# Patient Record
Sex: Female | Born: 1975 | Race: White | Hispanic: No | Marital: Married | Smoking: Never smoker
Health system: Southern US, Community
[De-identification: ages and names within clinical notes are randomized; demographics above are authoritative.]

## PROBLEM LIST (undated history)

## (undated) DIAGNOSIS — C44311 Basal cell carcinoma of skin of nose: Secondary | ICD-10-CM

## (undated) DIAGNOSIS — D219 Benign neoplasm of connective and other soft tissue, unspecified: Secondary | ICD-10-CM

## (undated) DIAGNOSIS — E559 Vitamin D deficiency, unspecified: Secondary | ICD-10-CM

## (undated) DIAGNOSIS — M419 Scoliosis, unspecified: Secondary | ICD-10-CM

## (undated) HISTORY — DX: Basal cell carcinoma of skin of nose: C44.311

## (undated) HISTORY — PX: MOHS SURGERY: SHX181

## (undated) HISTORY — DX: Benign neoplasm of connective and other soft tissue, unspecified: D21.9

## (undated) HISTORY — PX: SKIN CANCER DESTRUCTION: SHX778

## (undated) HISTORY — DX: Scoliosis, unspecified: M41.9

## (undated) HISTORY — DX: Vitamin D deficiency, unspecified: E55.9

---

## 2013-04-05 ENCOUNTER — Ambulatory Visit: Payer: Self-pay | Admitting: Family Medicine

## 2014-10-16 ENCOUNTER — Ambulatory Visit: Payer: Self-pay | Admitting: Internal Medicine

## 2016-12-07 ENCOUNTER — Other Ambulatory Visit: Payer: Self-pay | Admitting: Obstetrics and Gynecology

## 2016-12-10 ENCOUNTER — Other Ambulatory Visit: Payer: Self-pay | Admitting: Obstetrics and Gynecology

## 2016-12-10 ENCOUNTER — Other Ambulatory Visit: Payer: Self-pay

## 2016-12-10 DIAGNOSIS — Z309 Encounter for contraceptive management, unspecified: Secondary | ICD-10-CM

## 2016-12-10 MED ORDER — NORGESTIMATE-ETH ESTRADIOL 0.25-35 MG-MCG PO TABS: 1.0000 | ORAL_TABLET | Freq: Every day | ORAL | 0 refills | Status: DC

## 2016-12-10 NOTE — Telephone Encounter (Signed)
Pt is calling to schedule annual 01/07/17 with AMS and needs 1 refill on Birthcontrol to get to appointment [facility] [place]. CB# [phone]

## 2016-12-10 NOTE — Telephone Encounter (Signed)
Pt aware birthcontrol was refilled until she can come in for her appointment in May. KJ CMA

## 2016-12-27 ENCOUNTER — Telehealth: Payer: Self-pay

## 2016-12-27 DIAGNOSIS — Z309 Encounter for contraceptive management, unspecified: Secondary | ICD-10-CM

## 2016-12-27 MED ORDER — NORGESTIMATE-ETH ESTRADIOL 0.25-35 MG-MCG PO TABS: 1.0000 | ORAL_TABLET | Freq: Every day | ORAL | 1 refills | Status: DC

## 2016-12-27 NOTE — Telephone Encounter (Signed)
Pt calling.  Has appt sched for end of May and needs refill on bc.  Refill eRx'd.  Pt's phone is not accepting calls.

## 2017-01-07 ENCOUNTER — Ambulatory Visit: Payer: Self-pay | Admitting: Obstetrics and Gynecology

## 2017-01-30 ENCOUNTER — Ambulatory Visit: Payer: Self-pay | Admitting: Obstetrics & Gynecology

## 2017-03-19 ENCOUNTER — Ambulatory Visit (INDEPENDENT_AMBULATORY_CARE_PROVIDER_SITE_OTHER): Payer: BC Managed Care – PPO | Admitting: Obstetrics & Gynecology

## 2017-03-19 ENCOUNTER — Encounter: Payer: Self-pay | Admitting: Obstetrics & Gynecology

## 2017-03-19 VITALS — BP 110/60 | HR 78 | Ht 66.0 in | Wt 112.0 lb

## 2017-03-19 DIAGNOSIS — Z124 Encounter for screening for malignant neoplasm of cervix: Secondary | ICD-10-CM

## 2017-03-19 DIAGNOSIS — Z800 Family history of malignant neoplasm of digestive organs: Secondary | ICD-10-CM

## 2017-03-19 DIAGNOSIS — Z309 Encounter for contraceptive management, unspecified: Secondary | ICD-10-CM | POA: Diagnosis not present

## 2017-03-19 DIAGNOSIS — Z1231 Encounter for screening mammogram for malignant neoplasm of breast: Secondary | ICD-10-CM

## 2017-03-19 DIAGNOSIS — Z1239 Encounter for other screening for malignant neoplasm of breast: Secondary | ICD-10-CM

## 2017-03-19 DIAGNOSIS — Z0000 Encounter for general adult medical examination without abnormal findings: Secondary | ICD-10-CM

## 2017-03-19 DIAGNOSIS — Z01419 Encounter for gynecological examination (general) (routine) without abnormal findings: Secondary | ICD-10-CM

## 2017-03-19 MED ORDER — NORGESTIMATE-ETH ESTRADIOL 0.25-35 MG-MCG PO TABS: 1.0000 | ORAL_TABLET | Freq: Every day | ORAL | 3 refills | Status: DC

## 2017-03-19 NOTE — Patient Instructions (Signed)
PAP every three years Mammogram every year    Call [phone] to schedule at [facility] yearly (with PCP)   

## 2017-03-19 NOTE — Progress Notes (Signed)
HPI:      [name] is a 41 y.o. [phone] who LMP was No LMP recorded., she presents today for her annual examination. The patient has no complaints today. The patient is sexually active. Her last pap: approximate date 2015 and was normal and last mammogram: patient has never had a mammogram. The patient does perform self breast exams.  There is no notable family history of breast or ovarian cancer in her family.  The patient has regular exercise: yes.  The patient denies current symptoms of depression.    GYN History: Contraception: OCP (estrogen/progesterone)  PMHx: Past Medical History:  Diagnosis Date  . Basal cell carcinoma (BCC) of skin of nose   . Basal cell carcinoma of nose    Past Surgical History:  Procedure Laterality Date  . CESAREAN SECTION    . MOHS SURGERY     Family History  Problem Relation Age of Onset  . Migraines Mother   . Diabetes Father   . Colon cancer Paternal Aunt   . Colon cancer Paternal Uncle   . Congestive Heart Failure Maternal Grandmother   . Diabetes Maternal Grandmother   . Congestive Heart Failure Paternal Grandmother   . Diabetes Paternal Grandmother   . Colon cancer Paternal Grandfather    Social History  Substance Use Topics  . Smoking status: Never Smoker  . Smokeless tobacco: Never Used  . Alcohol use No    Current Outpatient Prescriptions:  .  norgestimate-ethinyl estradiol (ORTHO-CYCLEN,SPRINTEC,PREVIFEM) 0.25-35 MG-MCG tablet, Take 1 tablet by mouth daily., Disp: 1 Package, Rfl: 1 Allergies: Patient has no known allergies.  Review of Systems  Constitutional: Negative for chills, fever and malaise/fatigue.  HENT: Negative for congestion, sinus pain and sore throat.   Eyes: Negative for blurred vision and pain.  Respiratory: Negative for cough and wheezing.   Cardiovascular: Negative for chest pain and leg swelling.  Gastrointestinal: Negative for abdominal pain, constipation, diarrhea, heartburn, nausea and  vomiting.  Genitourinary: Negative for dysuria, frequency, hematuria and urgency.  Musculoskeletal: Negative for back pain, joint pain, myalgias and neck pain.  Skin: Negative for itching and rash.  Neurological: Negative for dizziness, tremors and weakness.  Endo/Heme/Allergies: Does not bruise/bleed easily.  Psychiatric/Behavioral: Negative for depression. The patient is not nervous/anxious and does not have insomnia.     Objective: BP 110/60   Pulse 78   Ht 5\' 6"  (1.676 m)   Wt 112 lb (50.8 kg)   BMI 18.08 kg/m   Filed Weights   03/19/17 1530  Weight: 112 lb (50.8 kg)   Body mass index is 18.08 kg/m. Physical Exam  Constitutional: She is oriented to person, place, and time. She appears well-developed and well-nourished. No distress.  Genitourinary: Rectum normal, vagina normal and uterus normal. Pelvic exam was performed with patient supine. There is no rash or lesion on the right labia. There is no rash or lesion on the left labia. Vagina exhibits no lesion. No bleeding in the vagina. Right adnexum does not display mass and does not display tenderness. Left adnexum does not display mass and does not display tenderness. Cervix does not exhibit motion tenderness, lesion, friability or polyp.   Uterus is mobile and midaxial. Uterus is not enlarged or exhibiting a mass.  HENT:  Head: Normocephalic and atraumatic. Head is without laceration.  Right Ear: Hearing normal.  Left Ear: Hearing normal.  Nose: No epistaxis.  No foreign bodies.  Mouth/Throat: Uvula is midline, oropharynx is clear and moist and mucous membranes  are normal.  Eyes: Pupils are equal, round, and reactive to light.  Neck: Normal range of motion. Neck supple. No thyromegaly present.  Cardiovascular: Normal rate and regular rhythm.  Exam reveals no gallop and no friction rub.   No murmur heard. Pulmonary/Chest: Effort normal and breath sounds normal. No respiratory distress. She has no wheezes. Right breast  exhibits no mass, no skin change and no tenderness. Left breast exhibits no mass, no skin change and no tenderness.  Abdominal: Soft. Bowel sounds are normal. She exhibits no distension. There is no tenderness. There is no rebound.  Musculoskeletal: Normal range of motion.  Neurological: She is alert and oriented to person, place, and time. No cranial nerve deficit.  Skin: Skin is warm and dry.  Psychiatric: She has a normal mood and affect. Judgment normal.  Vitals reviewed.   Assessment:  ANNUAL EXAM 1. Annual physical exam   2. Screening for breast cancer   3. Screening for cervical cancer   4. Encounter for contraceptive management, unspecified type      Screening Plan:            1.  Cervical Screening-  Pap smear done today  2. Breast screening- Exam annually and mammogram>40 planned   3. Colonoscopy every 10 years, Hemoccult testing - after age 59; pt desires sooner due to [place]  4. Labs managed by PCP  5. Counseling for contraception: oral contraceptives (estrogen/progesterone)  Other:  1. Annual physical exam  2. Screening for breast cancer- MMG  3. Screening for cervical cancer- PAP q 3 yrs  4. Encounter for contraceptive management, unspecified type OCP    F/U  Return in about 1 year (around 03/19/2018) for Annual.  Barnett Applebaum, MD, [facility], [facility] 03/19/2017  3:51 PM

## 2017-03-22 LAB — IGP, APTIMA HPV
HPV APTIMA: NEGATIVE
PAP Smear Comment: 0

## 2017-04-08 ENCOUNTER — Telehealth: Payer: Self-pay

## 2017-04-08 ENCOUNTER — Other Ambulatory Visit: Payer: Self-pay

## 2017-04-08 DIAGNOSIS — Z800 Family history of malignant neoplasm of digestive organs: Secondary | ICD-10-CM

## 2017-04-08 NOTE — Telephone Encounter (Signed)
Gastroenterology Pre-Procedure Review  Request Date: 07/08/17 Requesting Physician: Dr. Allen Norris  PATIENT REVIEW QUESTIONS: The patient responded to the following health history questions as indicated:    1. Are you having any GI issues? no 2. Do you have a personal history of Polyps? no 3. Do you have a family history of Colon Cancer or Polyps? yes (Aunt and Uncle on Fathers side) 4. Diabetes Mellitus? no 5. Joint replacements in the past 12 months?no 6. Major health problems in the past 3 months?no 7. Any artificial heart valves, MVP, or defibrillator?no    MEDICATIONS & ALLERGIES:    Patient reports the following regarding taking any anticoagulation/antiplatelet therapy:   Plavix, Coumadin, Eliquis, Xarelto, Lovenox, Pradaxa, Brilinta, or Effient? no Aspirin? no  Patient confirms/reports the following medications:  Current Outpatient Prescriptions  Medication Sig Dispense Refill  . norgestimate-ethinyl estradiol (ORTHO-CYCLEN,SPRINTEC,PREVIFEM) 0.25-35 MG-MCG tablet Take 1 tablet by mouth daily. 3 Package 3   No current facility-administered medications for this visit.     Patient confirms/reports the following allergies:  No Known Allergies  No orders of the defined types were placed in this encounter.   AUTHORIZATION INFORMATION Primary Insurance: 1D#: Group #:  Secondary Insurance: 1D#: Group #:  SCHEDULE INFORMATION: Date: 07/08/17 Time: Location:[facility]

## 2017-04-08 NOTE — Telephone Encounter (Signed)
ok 

## 2017-04-08 NOTE — Telephone Encounter (Signed)
Pt's insurance does not cover the place we referred her to for a colonoscopy as in network. She requests a referral to [facility] gastro as they are in network.  There fax vumber is [phone]

## 2017-04-09 ENCOUNTER — Telehealth: Payer: Self-pay | Admitting: Gastroenterology

## 2017-04-09 NOTE — Telephone Encounter (Signed)
Pt stated that she checked with her insurance company and her insurance would not cover it being done at a hospital therefore she has decided to go with a facility in [place] that her insurance would cover and cost her less money to have done.

## 2017-04-09 NOTE — Telephone Encounter (Signed)
Patient LVM to cx procedure for 07/08/17 w/ Dr. Allen Norris.

## 2017-07-08 ENCOUNTER — Ambulatory Visit: Admit: 2017-07-08 | Payer: Self-pay | Admitting: Gastroenterology

## 2017-07-08 SURGERY — COLONOSCOPY WITH PROPOFOL
Anesthesia: General

## 2018-02-24 ENCOUNTER — Telehealth: Payer: Self-pay

## 2018-02-24 DIAGNOSIS — Z309 Encounter for contraceptive management, unspecified: Secondary | ICD-10-CM

## 2018-02-24 MED ORDER — NORGESTIMATE-ETH ESTRADIOL 0.25-35 MG-MCG PO TABS: 1.0000 | ORAL_TABLET | Freq: Every day | ORAL | 0 refills | Status: DC

## 2018-02-24 NOTE — Telephone Encounter (Signed)
Spoke w/pt. Notified rf erx to pharmacy. Pt advised to check w/pharmacy prior to picking up if not signed up for text alerts.

## 2018-02-24 NOTE — Telephone Encounter (Signed)
Pt is scheduled for AE w/[facility] 03/23/18. Her rx has expired for Sprintec. Pt request refill. AE not due til after 03/19/18. Pharmacy on file is correct.

## 2018-03-23 ENCOUNTER — Ambulatory Visit: Payer: BC Managed Care – PPO | Admitting: Obstetrics & Gynecology

## 2018-03-30 ENCOUNTER — Ambulatory Visit (INDEPENDENT_AMBULATORY_CARE_PROVIDER_SITE_OTHER): Payer: BC Managed Care – PPO | Admitting: Obstetrics & Gynecology

## 2018-03-30 ENCOUNTER — Encounter: Payer: Self-pay | Admitting: Obstetrics & Gynecology

## 2018-03-30 VITALS — BP 120/80 | Ht 66.0 in | Wt 115.0 lb

## 2018-03-30 DIAGNOSIS — Z131 Encounter for screening for diabetes mellitus: Secondary | ICD-10-CM

## 2018-03-30 DIAGNOSIS — Z1239 Encounter for other screening for malignant neoplasm of breast: Secondary | ICD-10-CM

## 2018-03-30 DIAGNOSIS — Z309 Encounter for contraceptive management, unspecified: Secondary | ICD-10-CM

## 2018-03-30 DIAGNOSIS — Z01419 Encounter for gynecological examination (general) (routine) without abnormal findings: Secondary | ICD-10-CM

## 2018-03-30 DIAGNOSIS — Z1231 Encounter for screening mammogram for malignant neoplasm of breast: Secondary | ICD-10-CM

## 2018-03-30 DIAGNOSIS — Z1329 Encounter for screening for other suspected endocrine disorder: Secondary | ICD-10-CM

## 2018-03-30 DIAGNOSIS — Z1321 Encounter for screening for nutritional disorder: Secondary | ICD-10-CM

## 2018-03-30 DIAGNOSIS — Z13220 Encounter for screening for lipoid disorders: Secondary | ICD-10-CM

## 2018-03-30 MED ORDER — NORGESTIMATE-ETH ESTRADIOL 0.25-35 MG-MCG PO TABS: 1.0000 | ORAL_TABLET | Freq: Every day | ORAL | 3 refills | Status: DC

## 2018-03-30 NOTE — Progress Notes (Signed)
HPI:      Jaime Reed is a 42 y.o. [id number] who LMP was Patient's last menstrual period was 02/23/2018., she presents today for her annual examination. The patient has no complaints today. The patient is sexually active. Her last pap: approximate date 2018 and was normal and last mammogram: patient does not recall results of last mammogram. The patient does perform self breast exams.  There is no notable family history of breast or ovarian cancer in her family.  The patient has regular exercise: yes.  The patient denies current symptoms of depression.    GYN History: Contraception: OCP (estrogen/progesterone)  PMHx: Past Medical History:  Diagnosis Date  . Basal cell carcinoma (BCC) of skin of nose   . Basal cell carcinoma of nose    Past Surgical History:  Procedure Laterality Date  . CESAREAN SECTION    . MOHS SURGERY    . SKIN CANCER DESTRUCTION     Family History  Problem Relation Age of Onset  . Migraines Mother   . Diabetes Father   . Colon cancer Paternal Aunt   . Colon cancer Paternal Uncle   . Congestive Heart Failure Maternal Grandmother   . Diabetes Maternal Grandmother   . Congestive Heart Failure Paternal Grandmother   . Diabetes Paternal Grandmother   . Colon cancer Paternal Grandfather    Social History   Tobacco Use  . Smoking status: Never Smoker  . Smokeless tobacco: Never Used  Substance Use Topics  . Alcohol use: No  . Drug use: No    Current Outpatient Medications:  .  norgestimate-ethinyl estradiol (ORTHO-CYCLEN,SPRINTEC,PREVIFEM) 0.25-35 MG-MCG tablet, Take 1 tablet by mouth daily for 28 days., Disp: 3 Package, Rfl: 3 Allergies: Patient has no known allergies.  Review of Systems  Constitutional: Negative for chills, fever and malaise/fatigue.  HENT: Negative for congestion, sinus pain and sore throat.   Eyes: Negative for blurred vision and pain.  Respiratory: Negative for cough and wheezing.   Cardiovascular: Negative for chest  pain and leg swelling.  Gastrointestinal: Negative for abdominal pain, constipation, diarrhea, heartburn, nausea and vomiting.  Genitourinary: Negative for dysuria, frequency, hematuria and urgency.  Musculoskeletal: Negative for back pain, joint pain, myalgias and neck pain.  Skin: Negative for itching and rash.  Neurological: Negative for dizziness, tremors and weakness.  Endo/Heme/Allergies: Does not bruise/bleed easily.  Psychiatric/Behavioral: Negative for depression. The patient is not nervous/anxious and does not have insomnia.     Objective: BP 120/80   Ht 5\' 6"  (1.676 m)   Wt 115 lb (52.2 kg)   LMP 02/23/2018   BMI 18.56 kg/m   Filed Weights   03/30/18 1547  Weight: 115 lb (52.2 kg)   Body mass index is 18.56 kg/m. Physical Exam  Constitutional: She is oriented to person, place, and time. She appears well-developed and well-nourished. No distress.  Genitourinary: Rectum normal, vagina normal and uterus normal. Pelvic exam was performed with patient supine. There is no rash or lesion on the right labia. There is no rash or lesion on the left labia. Vagina exhibits no lesion. No bleeding in the vagina. Right adnexum does not display mass and does not display tenderness. Left adnexum does not display mass and does not display tenderness. Cervix does not exhibit motion tenderness, lesion, friability or polyp.   Uterus is mobile and midaxial. Uterus is not enlarged or exhibiting a mass.  HENT:  Head: Normocephalic and atraumatic. Head is without laceration.  Right Ear: Hearing normal.  Left  Ear: Hearing normal.  Nose: No epistaxis.  No foreign bodies.  Mouth/Throat: Uvula is midline, oropharynx is clear and moist and mucous membranes are normal.  Eyes: Pupils are equal, round, and reactive to light.  Neck: Normal range of motion. Neck supple. No thyromegaly present.  Cardiovascular: Normal rate and regular rhythm. Exam reveals no gallop and no friction rub.  No murmur  heard. Pulmonary/Chest: Effort normal and breath sounds normal. No respiratory distress. She has no wheezes. Right breast exhibits no mass, no skin change and no tenderness. Left breast exhibits no mass, no skin change and no tenderness.  Abdominal: Soft. Bowel sounds are normal. She exhibits no distension. There is no tenderness. There is no rebound.  Musculoskeletal: Normal range of motion.  Neurological: She is alert and oriented to person, place, and time. No cranial nerve deficit.  Skin: Skin is warm and dry.  Psychiatric: She has a normal mood and affect. Judgment normal.  Vitals reviewed.  Assessment:  ANNUAL EXAM 1. Screening for diabetes mellitus   2. Encounter for contraceptive management, unspecified type   3. Screening for breast cancer   4. Screening for thyroid disorder   5. Encounter for vitamin deficiency screening   6. Screening for cholesterol level    Screening Plan:            1.  Cervical Screening-  Pap smear done today  2. Breast screening- Exam annually and mammogram>40 planned   3. Colonoscopy every 10 years, Hemoccult testing - after age 33 due to family history and personal concerns  4. Labs To return fasting at a later date  5. Counseling for contraception: oral contraceptives (estrogen/progesterone)    F/U  Return in about 1 year (around 03/31/2019) for Annual.  Barnett Applebaum, MD, [facility], [facility] 03/30/2018  4:15 PM

## 2018-03-30 NOTE — Patient Instructions (Signed)
PAP every three years Mammogram every year    Call [phone] to schedule at [facility] soon

## 2018-04-03 ENCOUNTER — Other Ambulatory Visit: Payer: BC Managed Care – PPO

## 2018-04-03 DIAGNOSIS — Z1321 Encounter for screening for nutritional disorder: Secondary | ICD-10-CM

## 2018-04-03 DIAGNOSIS — Z13220 Encounter for screening for lipoid disorders: Secondary | ICD-10-CM

## 2018-04-03 DIAGNOSIS — Z1329 Encounter for screening for other suspected endocrine disorder: Secondary | ICD-10-CM

## 2018-04-03 DIAGNOSIS — Z131 Encounter for screening for diabetes mellitus: Secondary | ICD-10-CM

## 2018-04-04 LAB — LIPID PANEL
CHOL/HDL RATIO: 2.5 ratio (ref 0.0–4.4)
Cholesterol, Total: 169 mg/dL (ref 100–199)
HDL: 68 mg/dL (ref >39)
LDL CALC: 69 mg/dL (ref 0–99)
Triglycerides: 159 mg/dL — ABNORMAL HIGH (ref 0–149)
VLDL CHOLESTEROL CAL: 32 mg/dL (ref 5–40)

## 2018-04-04 LAB — VITAMIN D 25 HYDROXY (VIT D DEFICIENCY, FRACTURES): Vit D, 25-Hydroxy: 25.4 ng/mL — ABNORMAL LOW (ref 30.0–100.0)

## 2018-04-04 LAB — GLUCOSE, FASTING: Glucose, Plasma: 85 mg/dL (ref 65–99)

## 2018-04-04 LAB — TSH: TSH: 1.94 u[IU]/mL (ref 0.450–4.500)

## 2018-04-14 ENCOUNTER — Encounter: Payer: Self-pay | Admitting: Obstetrics & Gynecology

## 2018-05-11 ENCOUNTER — Other Ambulatory Visit: Payer: Self-pay | Admitting: Obstetrics & Gynecology

## 2018-07-06 ENCOUNTER — Telehealth: Payer: Self-pay

## 2018-07-06 NOTE — Telephone Encounter (Signed)
Left voice message to remind pt to schedule her mammo 

## 2018-07-06 NOTE — Telephone Encounter (Signed)
-----   Message from Gae Dry, MD sent at 07/06/2018  8:06 AM EST ----- Regarding: MMG Received notice she has not received MMG yet as ordered at her Annual. Please check and encourage her to do this, and document conversation.

## 2018-12-22 ENCOUNTER — Ambulatory Visit: Payer: BC Managed Care – PPO | Admitting: Obstetrics & Gynecology

## 2018-12-29 ENCOUNTER — Ambulatory Visit (INDEPENDENT_AMBULATORY_CARE_PROVIDER_SITE_OTHER): Payer: BC Managed Care – PPO | Admitting: Obstetrics & Gynecology

## 2018-12-29 ENCOUNTER — Other Ambulatory Visit: Payer: Self-pay

## 2018-12-29 ENCOUNTER — Encounter: Payer: Self-pay | Admitting: Obstetrics & Gynecology

## 2018-12-29 VITALS — Ht 66.0 in | Wt 117.0 lb

## 2018-12-29 DIAGNOSIS — R102 Pelvic and perineal pain: Secondary | ICD-10-CM | POA: Diagnosis not present

## 2018-12-29 NOTE — Progress Notes (Signed)
Virtual Visit via Telephone Note  I connected with Jaime Reed on 12/29/18 at  2:50 PM EDT by telephone and verified that I am speaking with the correct person using two identifiers.   I discussed the limitations, risks, security and privacy concerns of performing an evaluation and management service by telephone and the availability of in person appointments. I also discussed with the patient that there may be a patient responsible charge related to this service. The patient expressed understanding and agreed to proceed. She was at home and I was in my office.  History of Present Illness: Chief Complaint  Patient presents with  . Pelvic Pain   History of Present Illness:   Patient is a 43 y.o. [id number] who LMP was last month and she has regular cycles on OCPs, presents today for a problem visit.  She complains of pain.   Her pain is localized to the RLQ and LLQ area, described as intermittent, sharp and stabbing, began several months ago and its severity is described as moderate. The pain radiates to the  Non-radiating. She nonehas these associated symptoms which include . Patient has these modifiers which include relaxation that make it better and activity; always seems to happen at night when she lies down for bed that make it worse.  Context includes: prior surgery (CS) but no other context.  Previous studies include none.  Recent Urine culture neg for UTI.  PMHx: She  has a past medical history of Basal cell carcinoma (BCC) of skin of nose and Basal cell carcinoma of nose. Also,  has a past surgical history that includes Cesarean section; Mohs surgery; and Skin cancer destruction., family history includes Colon cancer in her paternal aunt, paternal grandfather, and paternal uncle; Congestive Heart Failure in her maternal grandmother and paternal grandmother; Diabetes in her father, maternal grandmother, and paternal grandmother; Migraines in her mother.,  reports that she has never smoked.  She has never used smokeless tobacco. She reports that she does not drink alcohol or use drugs.  She has a current medication list which includes the following prescription(s): norgestimate-ethinyl estradiol. Also, has No Known Allergies.  Review of Systems  Constitutional: Negative for chills, fever and malaise/fatigue.  HENT: Negative for congestion, sinus pain and sore throat.   Eyes: Negative for blurred vision and pain.  Respiratory: Negative for cough and wheezing.   Cardiovascular: Negative for chest pain and leg swelling.  Gastrointestinal: Negative for abdominal pain, constipation, diarrhea, heartburn, nausea and vomiting.  Genitourinary: Negative for dysuria, frequency, hematuria and urgency.  Musculoskeletal: Negative for back pain, joint pain, myalgias and neck pain.  Skin: Negative for itching and rash.  Neurological: Negative for dizziness, tremors and weakness.  Endo/Heme/Allergies: Does not bruise/bleed easily.  Psychiatric/Behavioral: Negative for depression. The patient is not nervous/anxious and does not have insomnia.     Observations/Objective: No exam today, due to telephone eVisit due to [facility] virus restriction on elective visits and procedures.  Prior visits reviewed along with ultrasounds/labs as indicated.  Assessment and Plan: 1. Pelvic pain Etiology options discussed; mass, cysts, hydro, adhesive dx, endometriosis, other - Evaluation to include exam, [place] - US PELVIC COMPLETE WITH TRANSVAGINAL - NSAIDs at dinnertime to see if helps with nighttime pain.  Pros and cons of this medication ,odality discussed.  (no Rx, use OTC NSAID here at first).  Follow Up Instructions: GYN [place] and appt w exam   I discussed the assessment and treatment plan with the patient. The patient was provided an opportunity  to ask questions and all were answered. The patient agreed with the plan and demonstrated an understanding of the instructions.   The patient was advised to call  back or seek an in-person evaluation if the symptoms worsen or if the condition fails to improve as anticipated.  I provided 12 minutes of non-face-to-face time during this encounter.  Hoyt Koch, MD [facility], [facility] 12/29/2018  3:37 PM

## 2019-01-07 ENCOUNTER — Other Ambulatory Visit: Payer: Self-pay | Admitting: Obstetrics & Gynecology

## 2019-01-07 ENCOUNTER — Ambulatory Visit (INDEPENDENT_AMBULATORY_CARE_PROVIDER_SITE_OTHER): Payer: BC Managed Care – PPO

## 2019-01-07 ENCOUNTER — Encounter: Payer: Self-pay | Admitting: Obstetrics & Gynecology

## 2019-01-07 ENCOUNTER — Ambulatory Visit (INDEPENDENT_AMBULATORY_CARE_PROVIDER_SITE_OTHER): Payer: BC Managed Care – PPO | Admitting: Obstetrics & Gynecology

## 2019-01-07 ENCOUNTER — Telehealth: Payer: Self-pay | Admitting: Obstetrics & Gynecology

## 2019-01-07 ENCOUNTER — Other Ambulatory Visit: Payer: Self-pay

## 2019-01-07 VITALS — BP 120/80 | Ht 66.0 in | Wt 121.0 lb

## 2019-01-07 DIAGNOSIS — D251 Intramural leiomyoma of uterus: Secondary | ICD-10-CM

## 2019-01-07 DIAGNOSIS — R102 Pelvic and perineal pain unspecified side: Secondary | ICD-10-CM

## 2019-01-07 DIAGNOSIS — D219 Benign neoplasm of connective and other soft tissue, unspecified: Secondary | ICD-10-CM

## 2019-01-07 DIAGNOSIS — D252 Subserosal leiomyoma of uterus: Secondary | ICD-10-CM

## 2019-01-07 NOTE — Telephone Encounter (Signed)
Left message to advise pt we could do the u/s either way, it was up to her

## 2019-01-07 NOTE — Telephone Encounter (Signed)
Patient is calling to find out if it's ok to be menstruating during this appointment schedule today for TVUS and follow up or if she need to reschedule. Please advise patient's appointment starts at 2:30 today. Thank you!

## 2019-01-07 NOTE — Patient Instructions (Signed)

## 2019-01-07 NOTE — Progress Notes (Signed)
HPI: Uterine Fibroids Patient presents with new findings of uterine fibroids.  Pt has pain more so than period concerns.  Her pain is localized to the RLQ and LLQ area, described as intermittent, sharp and stabbing, began several months ago and its severity is described as moderate. The pain radiates to the  Non-radiating. She nonehas these associated symptoms which include . Patient has these modifiers which include relaxation that make it better and activity; always seems to happen at night when she lies down for bed that make it worse.  Context includes: prior surgery (CS) but no other context.   Periods are regular every 28-30 days, lasting 4 days. Takes OCPs. Dysmenorrhea:mild, occurring throughout menses. Cyclic symptoms include none. No intermenstrual bleeding, spotting, or discharge.  Ultrasound demonstrates fibroids see below  PMHx: She  has a past medical history of Basal cell carcinoma (BCC) of skin of nose and Basal cell carcinoma of nose. Also,  has a past surgical history that includes Cesarean section; Mohs surgery; and Skin cancer destruction., family history includes Colon cancer in her paternal aunt, paternal grandfather, and paternal uncle; Congestive Heart Failure in her maternal grandmother and paternal grandmother; Diabetes in her father, maternal grandmother, and paternal grandmother; Migraines in her mother.,  reports that she has never smoked. She has never used smokeless tobacco. She reports that she does not drink alcohol or use drugs.  She has a current medication list which includes the following prescription(s): norgestimate-ethinyl estradiol. Also, has No Known Allergies.  Review of Systems  All other systems reviewed and are negative.   Objective: BP 120/80   Ht 5\' 6"  (1.676 m)   Wt 121 lb (54.9 kg)   LMP 01/07/2019   BMI 19.53 kg/m   Physical examination Constitutional NAD, Conversant  Skin No rashes, lesions or ulceration.   Extremities: Moves all  appropriately.  Normal ROM for age. No lymphadenopathy.  Neuro: Grossly intact  Psych: Oriented to PPT.  Normal mood. Normal affect.   US Pelvis Transvanginal Non-ob (tv Only)  Result Date: 01/07/2019 Patient Name: Jaime Reed DOB: 05-05-1976 MRN: [id number] ULTRASOUND REPORT Location: [facility] Date of Service: 01/07/2019 Indications:Pelvic Pain Findings: The uterus is anteverted and measures 10.6 x 6.9 x 5.7cm. Echo texture is heterogenous with evidence of focal masses. Within the uterus are multiple suspected fibroids measuring: Fibroid 1:  1.7 x 1.4 x 1.0cm (RT/calcified, SS) Fibroid 2:  4.0 x 3.7 x 3.0cm (RT, IM) Fibroid 3:  1.3 x 1.0 x 1.0cm (LT, SS) Multiple other fibroids seen <18mm. The Endometrium measures 6.9 mm. Right Ovary measures 2.3 x 1.9 x 1.8 cm. It is normal in appearance. Left Ovary measures 2.0 x 1.6 x 1.3 cm. It is normal in appearance. Survey of the adnexa demonstrates no adnexal masses. There is no free fluid in the cul de sac. Impression: 1. Heterogeneous, multi-fibroid uterus. Recommendations: 1.Clinical correlation with the patient's History and Physical Exam. Vita Barley, RT Review of ULTRASOUND.    I have personally reviewed images and report of recent ultrasound done at [facility]    Plan of management to be discussed with patient. Barnett Applebaum, MD, [facility], [facility] 01/07/2019  3:45 PM    Assessment: 1. Fibroids - Fibroid treatment such as [place], Lupron, Myomectomy, and Hysterectomy discussed in detail, with the pros and cons of each choice counseled.  No treatment as an option also discussed, as well as control of symptoms alone with hormone therapy. Information provided to the patient. -  US PELVIC COMPLETE WITH TRANSVAGINAL; Future monitoring for growth in 6 mos or as needed  2. Pelvic pain - OCP, IBF, Heat   A total of 20 minutes were spent face-to-face with the patient during this encounter and over half of that time dealt with  counseling and coordination of care.   Barnett Applebaum, MD, [facility], [facility] 01/07/2019  3:58 PM

## 2019-01-07 NOTE — Telephone Encounter (Signed)
Its OK unless she is very uncomfortable Reschedule if desired

## 2019-05-26 ENCOUNTER — Other Ambulatory Visit: Payer: Self-pay

## 2019-05-27 ENCOUNTER — Other Ambulatory Visit: Payer: Self-pay

## 2019-05-27 DIAGNOSIS — Z309 Encounter for contraceptive management, unspecified: Secondary | ICD-10-CM

## 2019-05-27 MED ORDER — NORGESTIMATE-ETH ESTRADIOL 0.25-35 MG-MCG PO TABS: 1.0000 | ORAL_TABLET | Freq: Every day | ORAL | 3 refills | Status: DC

## 2019-05-27 NOTE — Telephone Encounter (Signed)
Pt had to r/s annual d/t husband testing positive for COVID. Sent in RX to last to annual 10/16.

## 2019-06-02 ENCOUNTER — Ambulatory Visit: Payer: BC Managed Care – PPO | Admitting: Obstetrics & Gynecology

## 2019-06-18 ENCOUNTER — Ambulatory Visit: Payer: BC Managed Care – PPO | Admitting: Obstetrics & Gynecology

## 2019-07-05 ENCOUNTER — Ambulatory Visit: Payer: BC Managed Care – PPO

## 2019-07-05 ENCOUNTER — Ambulatory Visit: Payer: BC Managed Care – PPO | Admitting: Obstetrics & Gynecology

## 2019-07-07 ENCOUNTER — Ambulatory Visit: Payer: BC Managed Care – PPO

## 2019-07-07 ENCOUNTER — Ambulatory Visit: Payer: BC Managed Care – PPO | Admitting: Obstetrics & Gynecology

## 2019-07-08 ENCOUNTER — Other Ambulatory Visit: Payer: BC Managed Care – PPO

## 2019-07-08 ENCOUNTER — Ambulatory Visit: Payer: BC Managed Care – PPO | Admitting: Obstetrics & Gynecology

## 2019-07-22 ENCOUNTER — Ambulatory Visit (INDEPENDENT_AMBULATORY_CARE_PROVIDER_SITE_OTHER): Payer: BC Managed Care – PPO | Admitting: Obstetrics & Gynecology

## 2019-07-22 ENCOUNTER — Other Ambulatory Visit: Payer: Self-pay

## 2019-07-22 ENCOUNTER — Encounter: Payer: Self-pay | Admitting: Obstetrics & Gynecology

## 2019-07-22 ENCOUNTER — Ambulatory Visit (INDEPENDENT_AMBULATORY_CARE_PROVIDER_SITE_OTHER): Payer: BC Managed Care – PPO

## 2019-07-22 VITALS — BP 130/80 | Ht 65.0 in | Wt 127.0 lb

## 2019-07-22 DIAGNOSIS — R102 Pelvic and perineal pain: Secondary | ICD-10-CM | POA: Diagnosis not present

## 2019-07-22 DIAGNOSIS — D252 Subserosal leiomyoma of uterus: Secondary | ICD-10-CM

## 2019-07-22 DIAGNOSIS — D219 Benign neoplasm of connective and other soft tissue, unspecified: Secondary | ICD-10-CM

## 2019-07-22 DIAGNOSIS — Z01419 Encounter for gynecological examination (general) (routine) without abnormal findings: Secondary | ICD-10-CM | POA: Diagnosis not present

## 2019-07-22 DIAGNOSIS — Z1231 Encounter for screening mammogram for malignant neoplasm of breast: Secondary | ICD-10-CM

## 2019-07-22 DIAGNOSIS — Z309 Encounter for contraceptive management, unspecified: Secondary | ICD-10-CM

## 2019-07-22 MED ORDER — NORGESTIMATE-ETH ESTRADIOL 0.25-35 MG-MCG PO TABS: 1.0000 | ORAL_TABLET | Freq: Every day | ORAL | 3 refills | Status: DC

## 2019-07-22 NOTE — Progress Notes (Signed)
HPI:      [name] is a 43 y.o. [id number] who LMP was Patient's last menstrual period was 06/28/2019., she presents today for her annual examination. The patient has no complaints today. The patient is sexually active. Her last pap: approximate date 2018 and was normal and last mammogram: patient has never had a mammogram. The patient does perform self breast exams.  There is no notable family history of breast or ovarian cancer in her family.  The patient has regular exercise: yes.  The patient denies current symptoms of depression.    History of fibroids [place] today, size of three fibroids relatively unchanged Pt has sx's of pressure at times on bladder or pelvis; periods are reg on OCPs, min bladder sx's  GYN History: Contraception: OCP (estrogen/progesterone)  PMHx: Past Medical History:  Diagnosis Date  . Basal cell carcinoma (BCC) of skin of nose   . Basal cell carcinoma of nose    Past Surgical History:  Procedure Laterality Date  . CESAREAN SECTION    . MOHS SURGERY    . SKIN CANCER DESTRUCTION     Family History  Problem Relation Age of Onset  . Migraines Mother   . Diabetes Father   . Colon cancer Paternal Aunt   . Colon cancer Paternal Uncle   . Congestive Heart Failure Maternal Grandmother   . Diabetes Maternal Grandmother   . Congestive Heart Failure Paternal Grandmother   . Diabetes Paternal Grandmother   . Colon cancer Paternal Grandfather    Social History   Tobacco Use  . Smoking status: Never Smoker  . Smokeless tobacco: Never Used  Substance Use Topics  . Alcohol use: No  . Drug use: No    Current Outpatient Medications:  .  norgestimate-ethinyl estradiol (ORTHO-CYCLEN) 0.25-35 MG-MCG tablet, Take 1 tablet by mouth daily for 28 days., Disp: 3 Package, Rfl: 3 Allergies: Patient has no known allergies.  Review of Systems  Constitutional: Negative for chills, fever and malaise/fatigue.  HENT: Negative for congestion, sinus pain and sore  throat.   Eyes: Negative for blurred vision and pain.  Respiratory: Negative for cough and wheezing.   Cardiovascular: Negative for chest pain and leg swelling.  Gastrointestinal: Negative for abdominal pain, constipation, diarrhea, heartburn, nausea and vomiting.  Genitourinary: Negative for dysuria, frequency, hematuria and urgency.  Musculoskeletal: Negative for back pain, joint pain, myalgias and neck pain.  Skin: Negative for itching and rash.  Neurological: Negative for dizziness, tremors and weakness.  Endo/Heme/Allergies: Does not bruise/bleed easily.  Psychiatric/Behavioral: Negative for depression. The patient is not nervous/anxious and does not have insomnia.     Objective: BP 130/80   Ht 5\' 5"  (1.651 m)   Wt 127 lb (57.6 kg)   LMP 06/28/2019   BMI 21.13 kg/m   Filed Weights   07/22/19 1433  Weight: 127 lb (57.6 kg)   Body mass index is 21.13 kg/m. Physical Exam Constitutional:      General: She is not in acute distress.    Appearance: She is well-developed.  Genitourinary:     Pelvic exam was performed with patient supine.     Vagina and rectum normal.     No lesions in the vagina.     No vaginal bleeding.     No cervical motion tenderness, friability, lesion or polyp.     Uterus is enlarged and mobile.     No uterine mass detected.    Uterus is anteverted and globular.  No right or left adnexal mass present.     Right adnexa not tender.     Left adnexa not tender.     Genitourinary Comments: 8 week sized  HENT:     Head: Normocephalic and atraumatic. No laceration.     Right Ear: Hearing normal.     Left Ear: Hearing normal.     Mouth/Throat:     Pharynx: Uvula midline.  Eyes:     Pupils: Pupils are equal, round, and reactive to light.  Neck:     Musculoskeletal: Normal range of motion and neck supple.     Thyroid: No thyromegaly.  Cardiovascular:     Rate and Rhythm: Normal rate and regular rhythm.     Heart sounds: No murmur. No friction rub.  No gallop.   Pulmonary:     Effort: Pulmonary effort is normal. No respiratory distress.     Breath sounds: Normal breath sounds. No wheezing.  Chest:     Breasts:        Right: No mass, skin change or tenderness.        Left: No mass, skin change or tenderness.  Abdominal:     General: Bowel sounds are normal. There is no distension.     Palpations: Abdomen is soft.     Tenderness: There is no abdominal tenderness. There is no rebound.  Musculoskeletal: Normal range of motion.  Neurological:     Mental Status: She is alert and oriented to person, place, and time.     Cranial Nerves: No cranial nerve deficit.  Skin:    General: Skin is warm and dry.  Psychiatric:        Judgment: Judgment normal.  Vitals signs reviewed.     Assessment:  ANNUAL EXAM 1. Women's annual routine gynecological examination   2. Encounter for contraceptive management, unspecified type   3. Encounter for screening mammogram for malignant neoplasm of breast   4. Fibroids      Screening Plan:            1.  Cervical Screening-  Pap smear schedule reviewed with patient  2. Breast screening- Exam annually and mammogram>40 planned   3. Colonoscopy every 10 years, Hemoccult testing - after age 43  4. Labs - last year WNL  5. Counseling for contraception: oral contraceptives (estrogen/progesterone)  Encounter for contraceptive management, unspecified type - norgestimate-ethinyl estradiol (ORTHO-CYCLEN) 0.25-35 MG-MCG tablet; Take 1 tablet by mouth daily for 28 days.  Dispense: 3 Package; Refill: 3   6. Fibroids Fibroid treatment such as [place], Lupron, Myomectomy, and Hysterectomy discussed in detail, with the pros and cons of each choice counseled.  No treatment as an option also discussed, as well as control of symptoms alone with hormone therapy. Information provided to the patient.     F/U  Return in about 1 year (around 07/21/2020) for Annual.  Barnett Applebaum, MD, [facility], [facility] 07/22/2019  3:13 PM

## 2019-07-22 NOTE — Patient Instructions (Signed)
PAP every three years Mammogram every year    Call [phone] to schedule at [facility]oscopy every 10 years Labs yearly (with PCP)   

## 2019-08-10 ENCOUNTER — Telehealth: Payer: Self-pay

## 2019-10-20 ENCOUNTER — Telehealth: Payer: Self-pay

## 2019-10-20 NOTE — Telephone Encounter (Signed)
Left voicemail to advise pt to schedule mammo

## 2019-10-20 NOTE — Telephone Encounter (Signed)
-----   Message from Gae Dry, MD sent at 10/18/2019  1:10 PM EST ----- Regarding: MMG Received notice she has not received MMG yet as ordered at her Annual. Please check and encourage her to do this, and document conversation.

## 2020-02-16 NOTE — Telephone Encounter (Signed)
Pt aware.

## 2020-08-22 NOTE — Patient Instructions (Addendum)
I value your feedback and entrusting us with your care. If you get a [facility] patient survey, I would appreciate you taking the time to let us know about your experience today. Thank you!  As of August 12, 2019, your lab results will be released to your MyChart immediately, before I even have a chance to see them. Please give me time to review them and contact you if there are any abnormalities. Thank you for your patience.   [facility]: [phone]  [facility]: [phone]  

## 2020-08-22 NOTE — Progress Notes (Signed)
PCP:  Patient, No Pcp Per   Chief Complaint  Patient presents with  . Gynecologic Exam    Pelvic pain due to fibroids     HPI:      [name] is a 44 y.o. [id number] whose LMP was Patient's last menstrual period was 07/24/2020 (approximate)., presents today for her annual examination.  Her menses are regular every 28-30 days, lasting 4 days.  Dysmenorrhea none. She does not have intermenstrual bleeding.  Has pelvic pain with sex and with lying down, BLQ but RT>LT usually. Moving to change positions can increase pain, but pain improved in certain position. Pain is usually dull. Pt thinks related to fibroids but per 11/20 GYN u/s, leio very small. Pt with hx of scoliosis, not exercising. Has loose stools/diarrhea hx but sx improved since covid.  Fibroid 1: 42.4 x 40.0 x 45.0 mm subserosal right posterior Fibroid 2:11.2 x 10.0 x 10.6 mm subserosal anterior  Fibroid 3: 14.2 x 12.6 x 9.7 mm subserosal anterior left, calcified   Sex activity: currently sex active; contraception OCPs Last Pap: 03/19/17  Results were: no abnormalities /neg HPV DNA   Last mammogram: never There is no FH of breast cancer. There is no FH of ovarian cancer. The patient does not do self-breast exams.  Tobacco use: The patient denies current or previous tobacco use. Alcohol use: none No drug use.  Exercise: min active  Colonoscopy: never; has FH colon cancer in her uncle and aunt with polyps. Needs procedure at OP site for insurance purposes.  She does get adequate calcium but not Vitamin D in her diet. No recent labs.    Past Medical History:  Diagnosis Date  . Basal cell carcinoma (BCC) of skin of nose   . Leiomyoma   . Scoliosis     Past Surgical History:  Procedure Laterality Date  . CESAREAN SECTION    . MOHS SURGERY    . SKIN CANCER DESTRUCTION      Family History  Problem Relation Age of Onset  . Migraines Mother   . Diabetes Father   . Colon polyps Paternal Aunt   . Colon  cancer Paternal Uncle   . Congestive Heart Failure Maternal Grandmother   . Diabetes Maternal Grandmother   . Congestive Heart Failure Paternal Grandmother   . Diabetes Paternal Grandmother     Social History   Socioeconomic History  . Marital status: Married    Spouse name: Not on file  . Number of children: Not on file  . Years of education: Not on file  . Highest education level: Not on file  Occupational History  . Not on file  Tobacco Use  . Smoking status: Never Smoker  . Smokeless tobacco: Never Used  Vaping Use  . Vaping Use: Never used  Substance and Sexual Activity  . Alcohol use: No  . Drug use: No  . Sexual activity: Yes    Birth control/protection: Pill  Other Topics Concern  . Not on file  Social History Narrative  . Not on file   Social Determinants of Health   Financial Resource Strain: Not on file  Food Insecurity: Not on file  Transportation Needs: Not on file  Physical Activity: Not on file  Stress: Not on file  Social Connections: Not on file  Intimate Partner Violence: Not on file     Current Outpatient Medications:  .  norgestimate-ethinyl estradiol (ORTHO-CYCLEN) 0.25-35 MG-MCG tablet, Take 1 tablet by mouth daily for 28 days., Disp:  84 tablet, Rfl: 3     ROS:  Review of Systems  Constitutional: Negative for fatigue, fever and unexpected weight change.  Respiratory: Negative for cough, shortness of breath and wheezing.   Cardiovascular: Negative for chest pain, palpitations and leg swelling.  Gastrointestinal: Positive for diarrhea. Negative for blood in stool, constipation, nausea and vomiting.  Endocrine: Negative for cold intolerance, heat intolerance and polyuria.  Genitourinary: Positive for dyspareunia and pelvic pain. Negative for dysuria, flank pain, frequency, genital sores, hematuria, menstrual problem, urgency, vaginal bleeding, vaginal discharge and vaginal pain.  Musculoskeletal: Negative for back pain, joint swelling and  myalgias.  Skin: Negative for rash.  Neurological: Negative for dizziness, syncope, light-headedness, numbness and headaches.  Hematological: Negative for adenopathy.  Psychiatric/Behavioral: Negative for agitation, confusion, sleep disturbance and suicidal ideas. The patient is not nervous/anxious.   BREAST: No symptoms   Objective: BP 100/60   Ht 5\' 6"  (1.676 m)   Wt 123 lb (55.8 kg)   LMP 07/24/2020 (Approximate)   BMI 19.85 kg/m    Physical Exam Constitutional:      Appearance: She is well-developed.  Genitourinary:     Vulva normal.     Right Labia: No rash, tenderness or lesions.    Left Labia: No tenderness, lesions or rash.    No vaginal discharge, erythema or tenderness.      Right Adnexa: tender.    Right Adnexa: no mass present.    Left Adnexa: tender.    Left Adnexa: no mass present.    No cervical friability or polyp.     Uterus is tender.     Uterus is not enlarged.  Breasts:     Right: No mass, nipple discharge, skin change or tenderness.     Left: No mass, nipple discharge, skin change or tenderness.    Neck:     Thyroid: No thyromegaly.  Cardiovascular:     Rate and Rhythm: Normal rate and regular rhythm.     Heart sounds: Normal heart sounds. No murmur heard.   Pulmonary:     Effort: Pulmonary effort is normal.     Breath sounds: Normal breath sounds.  Abdominal:     Palpations: Abdomen is soft.     Tenderness: There is no abdominal tenderness. There is no guarding or rebound.  Musculoskeletal:        General: Normal range of motion.     Cervical back: Normal range of motion.  Lymphadenopathy:     Cervical: No cervical adenopathy.  Neurological:     General: No focal deficit present.     Mental Status: She is alert and oriented to person, place, and time.     Cranial Nerves: No cranial nerve deficit.  Skin:    General: Skin is warm and dry.  Psychiatric:        Mood and Affect: Mood normal.        Behavior: Behavior normal.         Thought Content: Thought content normal.        Judgment: Judgment normal.  Vitals reviewed.     Assessment/Plan: Encounter for annual routine gynecological examination  Encounter for surveillance of contraceptive pills - Plan: norgestimate-ethinyl estradiol (ORTHO-CYCLEN) 0.25-35 MG-MCG tablet; OCP RF  Encounter for screening mammogram for malignant neoplasm of breast - Plan: MM 3D SCREEN BREAST BILATERAL; pt to sched mammo  Pelvic pain--doubt it's leio related due to small size. Given that sx are more positional, think it's MSK. Back/pelvis stretching, offered pelvic PT. Pt  to consider and f/u prn ref  Screening for colon cancer - Plan: Ambulatory referral to Gastroenterology; refer to GI for procedure at [facility].  Blood tests for routine general physical examination - Plan: Comprehensive metabolic panel, Lipid panel  Screening cholesterol level - Plan: Lipid panel  Meds ordered this encounter  Medications  . norgestimate-ethinyl estradiol (ORTHO-CYCLEN) 0.25-35 MG-MCG tablet    Sig: Take 1 tablet by mouth daily for 28 days.    Dispense:  84 tablet    Refill:  3    Order Specific Question:   Supervising Provider    Answer:   Gae Dry [[id number]]             GYN counsel breast self exam, mammography screening, adequate intake of calcium and vitamin D, diet and exercise     F/U  Return in about 1 day (around 08/24/2020) for fasting labs.  Alpa Salvo B. Ifeanyichukwu Wickham, PA-C 08/23/2020 10:59 AM

## 2020-08-23 ENCOUNTER — Other Ambulatory Visit: Payer: Self-pay

## 2020-08-23 ENCOUNTER — Encounter: Payer: Self-pay | Admitting: Obstetrics and Gynecology

## 2020-08-23 ENCOUNTER — Ambulatory Visit (INDEPENDENT_AMBULATORY_CARE_PROVIDER_SITE_OTHER): Payer: BC Managed Care – PPO | Admitting: Obstetrics and Gynecology

## 2020-08-23 VITALS — BP 100/60 | Ht 66.0 in | Wt 123.0 lb

## 2020-08-23 DIAGNOSIS — Z01419 Encounter for gynecological examination (general) (routine) without abnormal findings: Secondary | ICD-10-CM | POA: Diagnosis not present

## 2020-08-23 DIAGNOSIS — Z3041 Encounter for surveillance of contraceptive pills: Secondary | ICD-10-CM | POA: Diagnosis not present

## 2020-08-23 DIAGNOSIS — Z1211 Encounter for screening for malignant neoplasm of colon: Secondary | ICD-10-CM

## 2020-08-23 DIAGNOSIS — Z13220 Encounter for screening for lipoid disorders: Secondary | ICD-10-CM

## 2020-08-23 DIAGNOSIS — R102 Pelvic and perineal pain: Secondary | ICD-10-CM | POA: Diagnosis not present

## 2020-08-23 DIAGNOSIS — Z1231 Encounter for screening mammogram for malignant neoplasm of breast: Secondary | ICD-10-CM

## 2020-08-23 DIAGNOSIS — Z0000 Encounter for general adult medical examination without abnormal findings: Secondary | ICD-10-CM

## 2020-08-23 MED ORDER — NORGESTIMATE-ETH ESTRADIOL 0.25-35 MG-MCG PO TABS: 1.0000 | ORAL_TABLET | Freq: Every day | ORAL | 3 refills | Status: DC

## 2020-08-31 ENCOUNTER — Other Ambulatory Visit: Payer: BC Managed Care – PPO

## 2020-08-31 ENCOUNTER — Other Ambulatory Visit: Payer: Self-pay

## 2020-08-31 DIAGNOSIS — Z0000 Encounter for general adult medical examination without abnormal findings: Secondary | ICD-10-CM

## 2020-08-31 DIAGNOSIS — Z13220 Encounter for screening for lipoid disorders: Secondary | ICD-10-CM

## 2020-09-01 LAB — LIPID PANEL
Chol/HDL Ratio: 2.4 ratio (ref 0.0–4.4)
Cholesterol, Total: 205 mg/dL — ABNORMAL HIGH (ref 100–199)
HDL: 85 mg/dL (ref >39)
LDL Chol Calc (NIH): 98 mg/dL (ref 0–99)
Triglycerides: 132 mg/dL (ref 0–149)
VLDL Cholesterol Cal: 22 mg/dL (ref 5–40)

## 2020-09-01 LAB — COMPREHENSIVE METABOLIC PANEL
ALT: 11 IU/L (ref 0–32)
AST: 15 IU/L (ref 0–40)
Albumin/Globulin Ratio: 1.5 (ref 1.2–2.2)
Albumin: 4.4 g/dL (ref 3.8–4.8)
Alkaline Phosphatase: 83 IU/L (ref 44–121)
BUN/Creatinine Ratio: 12 (ref 9–23)
BUN: 10 mg/dL (ref 6–24)
Bilirubin Total: 0.5 mg/dL (ref 0.0–1.2)
CO2: 23 mmol/L (ref 20–29)
Calcium: 9.8 mg/dL (ref 8.7–10.2)
Chloride: 105 mmol/L (ref 96–106)
Creatinine, Ser: 0.83 mg/dL (ref 0.57–1.00)
GFR calc Af Amer: 100 mL/min/{1.73_m2} (ref >59)
GFR calc non Af Amer: 87 mL/min/{1.73_m2} (ref >59)
Globulin, Total: 3 g/dL (ref 1.5–4.5)
Glucose: 85 mg/dL (ref 65–99)
Potassium: 4.3 mmol/L (ref 3.5–5.2)
Sodium: 141 mmol/L (ref 134–144)
Total Protein: 7.4 g/dL (ref 6.0–8.5)

## 2021-09-24 DIAGNOSIS — D219 Benign neoplasm of connective and other soft tissue, unspecified: Secondary | ICD-10-CM | POA: Insufficient documentation

## 2021-09-24 NOTE — Progress Notes (Signed)
PCP:  Patient, No Pcp Per (Inactive)   Chief Complaint  Patient presents with   Gynecologic Exam    No concerns     HPI:      [name] is a 46 y.o. [id number] whose LMP was Patient's last menstrual period was 08/27/2021 (approximate)., presents today for her annual examination.  Her menses are regular every 28-30 days, lasting 3-4 days on OCPs.  Dysmenorrhea none. She does not have intermenstrual bleeding.  Hx of small leio per 11/20 GYN u/s.  Fibroid 1: 42.4 x 40.0 x 45.0 mm subserosal right posterior Fibroid 2:11.2 x 10.0 x 10.6 mm subserosal anterior  Fibroid 3: 14.2 x 12.6 x 9.7 mm subserosal anterior left, calcified   Sex activity: currently sex active; contraception OCPs. No pain/bleeding. Last Pap: 03/19/17  Results were: no abnormalities /neg HPV DNA   Last mammogram: never There is no FH of breast cancer. There is no FH of ovarian cancer. The patient does not do self-breast exams.  Tobacco use: The patient denies current or previous tobacco use. Alcohol use: none No drug use.  Exercise: min active  Colonoscopy: never; has FH colon cancer in her uncle and aunt with polyps. Needs procedure at OP site for insurance purposes.  She does get adequate calcium but not Vitamin D in her diet. Normal labs 12/21; hx of Vit D deficiency 8/19.    Past Medical History:  Diagnosis Date   Basal cell carcinoma (BCC) of skin of nose    Leiomyoma    Scoliosis     Past Surgical History:  Procedure Laterality Date   CESAREAN SECTION     MOHS SURGERY     SKIN CANCER DESTRUCTION      Family History  Problem Relation Age of Onset   Migraines Mother    Diabetes Father    Colon polyps Paternal Aunt    Colon cancer Paternal Uncle    Congestive Heart Failure Maternal Grandmother    Diabetes Maternal Grandmother    Congestive Heart Failure Paternal Grandmother    Diabetes Paternal Grandmother     Social History   Socioeconomic History   Marital status: Married     Spouse name: Not on file   Number of children: Not on file   Years of education: Not on file   Highest education level: Not on file  Occupational History   Not on file  Tobacco Use   Smoking status: Never   Smokeless tobacco: Never  Vaping Use   Vaping Use: Never used  Substance and Sexual Activity   Alcohol use: No   Drug use: No   Sexual activity: Yes    Birth control/protection: Pill  Other Topics Concern   Not on file  Social History Narrative   Not on file   Social Determinants of Health   Financial Resource Strain: Not on file  Food Insecurity: Not on file  Transportation Needs: Not on file  Physical Activity: Not on file  Stress: Not on file  Social Connections: Not on file  Intimate Partner Violence: Not on file     Current Outpatient Medications:    norgestimate-ethinyl estradiol (ORTHO-CYCLEN) 0.25-35 MG-MCG tablet, Take 1 tablet by mouth daily for 28 days., Disp: 84 tablet, Rfl: 3     ROS:  Review of Systems  Constitutional:  Negative for fatigue, fever and unexpected weight change.  Respiratory:  Negative for cough, shortness of breath and wheezing.   Cardiovascular:  Negative for chest pain, palpitations and  leg swelling.  Gastrointestinal:  Positive for diarrhea. Negative for blood in stool, constipation, nausea and vomiting.  Endocrine: Negative for cold intolerance, heat intolerance and polyuria.  Genitourinary:  Negative for dyspareunia, dysuria, flank pain, frequency, genital sores, hematuria, menstrual problem, pelvic pain, urgency, vaginal bleeding, vaginal discharge and vaginal pain.  Musculoskeletal:  Negative for back pain, joint swelling and myalgias.  Skin:  Negative for rash.  Neurological:  Negative for dizziness, syncope, light-headedness, numbness and headaches.  Hematological:  Negative for adenopathy.  Psychiatric/Behavioral:  Negative for agitation, confusion, sleep disturbance and suicidal ideas. The patient is not  nervous/anxious.  BREAST: No symptoms   Objective: BP 100/70    Ht 5\' 6"  (1.676 m)    Wt 124 lb (56.2 kg)    LMP 08/27/2021 (Approximate)    BMI 20.01 kg/m    Physical Exam Constitutional:      Appearance: She is well-developed.  Genitourinary:     Vulva normal.     Right Labia: No rash, tenderness or lesions.    Left Labia: No tenderness, lesions or rash.    No vaginal discharge, erythema or tenderness.      Right Adnexa: not tender and no mass present.    Left Adnexa: not tender and no mass present.    No cervical friability or polyp.     Uterus is irregular.     Uterus is not enlarged or tender.  Breasts:    Right: No mass, nipple discharge, skin change or tenderness.     Left: No mass, nipple discharge, skin change or tenderness.  Neck:     Thyroid: No thyromegaly.  Cardiovascular:     Rate and Rhythm: Normal rate and regular rhythm.     Heart sounds: Normal heart sounds. No murmur heard. Pulmonary:     Effort: Pulmonary effort is normal.     Breath sounds: Normal breath sounds.  Abdominal:     Palpations: Abdomen is soft.     Tenderness: There is no abdominal tenderness. There is no guarding or rebound.  Musculoskeletal:        General: Normal range of motion.     Cervical back: Normal range of motion.  Lymphadenopathy:     Cervical: No cervical adenopathy.  Neurological:     General: No focal deficit present.     Mental Status: She is alert and oriented to person, place, and time.     Cranial Nerves: No cranial nerve deficit.  Skin:    General: Skin is warm and dry.  Psychiatric:        Mood and Affect: Mood normal.        Behavior: Behavior normal.        Thought Content: Thought content normal.        Judgment: Judgment normal.  Vitals reviewed.    Assessment/Plan: Encounter for annual routine gynecological examination  Cervical cancer screening - Plan: Cytology - PAP  Screening for HPV (human papillomavirus) - Plan: Cytology - PAP  Encounter for  surveillance of contraceptive pills - Plan: norgestimate-ethinyl estradiol (ORTHO-CYCLEN) 0.25-35 MG-MCG tablet; OCP RF  Encounter for screening mammogram for malignant neoplasm of breast - Plan: MM 3D SCREEN BREAST BILATERAL; pt to [facility] mamm[facility]  Screening for colon cancer - Plan: Ambulatory referral to Gastroenterology; refer to [facility] GI for OP colonoscopy site   Meds ordered this encounter  Medications   norgestimate-ethinyl estradiol (ORTHO-CYCLEN) 0.25-35 MG-MCG tablet    Sig: Take 1 tablet by mouth daily for 28 days.  Dispense:  84 tablet    Refill:  3    Order Specific Question:   Supervising Provider    Answer:   Gae Dry [[id number]]             GYN counsel breast self exam, mammography screening, adequate intake of calcium and vitamin D, diet and exercise     F/U  Return in about 1 year (around 09/25/2022).  Mihailo Sage B. Surie Suchocki, PA-C 09/25/2021 10:27 AM

## 2021-09-25 ENCOUNTER — Other Ambulatory Visit: Payer: Self-pay

## 2021-09-25 ENCOUNTER — Ambulatory Visit (INDEPENDENT_AMBULATORY_CARE_PROVIDER_SITE_OTHER): Payer: BC Managed Care – PPO | Admitting: Obstetrics and Gynecology

## 2021-09-25 ENCOUNTER — Other Ambulatory Visit (HOSPITAL_COMMUNITY)
Admission: RE | Admit: 2021-09-25 | Discharge: 2021-09-25 | Disposition: A | Discharge status: Home / Self Care | Payer: BC Managed Care – PPO | Source: Ambulatory Visit | Attending: Obstetrics and Gynecology | Admitting: Obstetrics and Gynecology

## 2021-09-25 ENCOUNTER — Encounter: Payer: Self-pay | Admitting: Obstetrics and Gynecology

## 2021-09-25 VITALS — BP 100/70 | Ht 66.0 in | Wt 124.0 lb

## 2021-09-25 DIAGNOSIS — Z1151 Encounter for screening for human papillomavirus (HPV): Secondary | ICD-10-CM | POA: Diagnosis present

## 2021-09-25 DIAGNOSIS — Z3041 Encounter for surveillance of contraceptive pills: Secondary | ICD-10-CM

## 2021-09-25 DIAGNOSIS — Z1231 Encounter for screening mammogram for malignant neoplasm of breast: Secondary | ICD-10-CM

## 2021-09-25 DIAGNOSIS — D219 Benign neoplasm of connective and other soft tissue, unspecified: Secondary | ICD-10-CM

## 2021-09-25 DIAGNOSIS — Z124 Encounter for screening for malignant neoplasm of cervix: Secondary | ICD-10-CM | POA: Insufficient documentation

## 2021-09-25 DIAGNOSIS — Z01419 Encounter for gynecological examination (general) (routine) without abnormal findings: Secondary | ICD-10-CM

## 2021-09-25 DIAGNOSIS — Z1211 Encounter for screening for malignant neoplasm of colon: Secondary | ICD-10-CM

## 2021-09-25 MED ORDER — NORGESTIMATE-ETH ESTRADIOL 0.25-35 MG-MCG PO TABS: 1.0000 | ORAL_TABLET | Freq: Every day | ORAL | 3 refills | Status: DC

## 2021-09-25 NOTE — Patient Instructions (Signed)
I value your feedback and you entrusting us with your care. If you get a [facility] patient survey, I would appreciate you taking the time to let us know about your experience today. Thank you!  [facility]: [phone]  [facility]: [phone]    

## 2021-09-27 LAB — CYTOLOGY - PAP
Comment: NEGATIVE
Diagnosis: NEGATIVE
Diagnosis: REACTIVE
High risk HPV: NEGATIVE

## 2021-10-16 ENCOUNTER — Ambulatory Visit
Admission: RE | Admit: 2021-10-16 | Discharge: 2021-10-16 | Disposition: A | Discharge status: Home / Self Care | Payer: BC Managed Care – PPO | Source: Ambulatory Visit | Attending: Obstetrics and Gynecology | Admitting: Obstetrics and Gynecology

## 2021-10-16 ENCOUNTER — Other Ambulatory Visit: Payer: Self-pay

## 2021-10-16 DIAGNOSIS — Z1231 Encounter for screening mammogram for malignant neoplasm of breast: Secondary | ICD-10-CM | POA: Diagnosis present

## 2021-10-17 ENCOUNTER — Other Ambulatory Visit: Payer: Self-pay | Admitting: Obstetrics and Gynecology

## 2021-10-17 DIAGNOSIS — N6489 Other specified disorders of breast: Secondary | ICD-10-CM

## 2021-10-17 DIAGNOSIS — R928 Other abnormal and inconclusive findings on diagnostic imaging of breast: Secondary | ICD-10-CM

## 2021-10-26 ENCOUNTER — Other Ambulatory Visit: Payer: Self-pay

## 2021-10-26 ENCOUNTER — Ambulatory Visit
Admission: RE | Admit: 2021-10-26 | Discharge: 2021-10-26 | Disposition: A | Discharge status: Home / Self Care | Payer: BC Managed Care – PPO | Source: Ambulatory Visit | Attending: Obstetrics and Gynecology | Admitting: Obstetrics and Gynecology

## 2021-10-26 DIAGNOSIS — N6489 Other specified disorders of breast: Secondary | ICD-10-CM | POA: Insufficient documentation

## 2021-10-26 DIAGNOSIS — R928 Other abnormal and inconclusive findings on diagnostic imaging of breast: Secondary | ICD-10-CM | POA: Insufficient documentation

## 2022-03-27 ENCOUNTER — Encounter: Payer: Self-pay | Admitting: Obstetrics and Gynecology

## 2022-03-27 DIAGNOSIS — R0683 Snoring: Secondary | ICD-10-CM

## 2022-03-27 DIAGNOSIS — R5383 Other fatigue: Secondary | ICD-10-CM

## 2022-07-02 ENCOUNTER — Other Ambulatory Visit: Payer: Self-pay | Admitting: Obstetrics and Gynecology

## 2022-07-02 DIAGNOSIS — Z3041 Encounter for surveillance of contraceptive pills: Secondary | ICD-10-CM

## 2022-07-14 ENCOUNTER — Other Ambulatory Visit: Payer: Self-pay | Admitting: Obstetrics and Gynecology

## 2022-07-14 DIAGNOSIS — Z3041 Encounter for surveillance of contraceptive pills: Secondary | ICD-10-CM

## 2022-07-15 ENCOUNTER — Other Ambulatory Visit: Payer: Self-pay | Admitting: Obstetrics and Gynecology

## 2022-07-15 DIAGNOSIS — Z3041 Encounter for surveillance of contraceptive pills: Secondary | ICD-10-CM

## 2022-07-15 MED ORDER — NORGESTIMATE-ETH ESTRADIOL 0.25-35 MG-MCG PO TABS: 1.0000 | ORAL_TABLET | Freq: Every day | ORAL | 0 refills | Status: DC

## 2022-09-28 ENCOUNTER — Ambulatory Visit
Admission: EM | Admit: 2022-09-28 | Discharge: 2022-09-28 | Disposition: A | Discharge status: Home / Self Care | Payer: BC Managed Care – PPO | Attending: Family Medicine | Admitting: Family Medicine

## 2022-09-28 DIAGNOSIS — R059 Cough, unspecified: Secondary | ICD-10-CM | POA: Diagnosis present

## 2022-09-28 DIAGNOSIS — Z1152 Encounter for screening for COVID-19: Secondary | ICD-10-CM | POA: Insufficient documentation

## 2022-09-28 DIAGNOSIS — J101 Influenza due to other identified influenza virus with other respiratory manifestations: Secondary | ICD-10-CM | POA: Diagnosis not present

## 2022-09-28 LAB — RESP PANEL BY RT-PCR (RSV, FLU A&B, COVID)  RVPGX2
Influenza A by PCR: NEGATIVE
Influenza B by PCR: POSITIVE — AB
Resp Syncytial Virus by PCR: NEGATIVE
SARS Coronavirus 2 by RT PCR: NEGATIVE

## 2022-09-28 MED ORDER — IPRATROPIUM BROMIDE 0.06 % NA SOLN: 2.0000 | Freq: Four times a day (QID) | NASAL | 12 refills | Status: DC

## 2022-09-28 MED ORDER — BENZONATATE 100 MG PO CAPS: 200.0000 mg | ORAL_CAPSULE | Freq: Three times a day (TID) | ORAL | 0 refills | Status: DC

## 2022-09-28 MED ORDER — PROMETHAZINE-DM 6.25-15 MG/5ML PO SYRP: 5.0000 mL | ORAL_SOLUTION | Freq: Four times a day (QID) | ORAL | 0 refills | Status: DC | PRN

## 2022-09-28 NOTE — ED Triage Notes (Signed)
Patient presents with cough, runny nose and bilateral ear pressure. Sx are worst at night.

## 2022-09-28 NOTE — ED Provider Notes (Signed)
[facility]    CSN: [id number] Arrival date & time: 09/28/22  3536      History   Chief Complaint Chief Complaint  Patient presents with   Cough    Entered by patient    HPI Jaime Reed is a 47 y.o. female.   HPI  47 year old female here for evaluation of respiratory complaints.  The patient reports that her symptoms began 4 days ago and they consist of runny nose, bilateral ear pressure, postnasal drip, cough that is intermittently productive of and worse at night, and sore throat.  She denies any measured fever, shortness of breath, wheezing, nausea, or vomiting.  She has had some intermittent diarrhea.  Past Medical History:  Diagnosis Date   Basal cell carcinoma (BCC) of skin of nose    Leiomyoma    Scoliosis     Patient Active Problem List   Diagnosis Date Noted   Leiomyoma 09/24/2021    Past Surgical History:  Procedure Laterality Date   CESAREAN SECTION     MOHS SURGERY     SKIN CANCER DESTRUCTION      OB History     Gravida  2   Para  2   Term  2   Preterm      AB      Living  2      SAB      IAB      Ectopic      Multiple      Live Births  2            Home Medications    Prior to Admission medications   Medication Sig Start Date End Date Taking? Authorizing Provider  benzonatate (TESSALON) 100 MG capsule Take 2 capsules (200 mg total) by mouth every 8 (eight) hours. 09/28/22  Yes Margarette Canada, NP  ipratropium (ATROVENT) 0.06 % nasal spray Place 2 sprays into both nostrils 4 (four) times daily. 09/28/22  Yes Margarette Canada, NP  promethazine-dextromethorphan (PROMETHAZINE-DM) 6.25-15 MG/5ML syrup Take 5 mLs by mouth 4 (four) times daily as needed. 09/28/22  Yes Margarette Canada, NP  norgestimate-ethinyl estradiol (ORTHO-CYCLEN) 0.25-35 MG-MCG tablet Take 1 tablet by mouth daily. 07/15/22 09/05/41  Copland, Deirdre Evener, PA-C    Family History Family History  Problem Relation Age of Onset   Migraines Mother     Diabetes Father    Colon polyps Paternal Aunt    Colon cancer Paternal Uncle    Congestive Heart Failure Maternal Grandmother    Diabetes Maternal Grandmother    Congestive Heart Failure Paternal Grandmother    Diabetes Paternal Grandmother    Breast cancer Neg Hx     Social History Social History   Tobacco Use   Smoking status: Never   Smokeless tobacco: Never  Vaping Use   Vaping Use: Never used  Substance Use Topics   Alcohol use: No   Drug use: No     Allergies   Patient has no known allergies.   Review of Systems Review of Systems  Constitutional:  Positive for chills and diaphoresis. Negative for fever.  HENT:  Positive for congestion, ear pain, rhinorrhea and sore throat.   Respiratory:  Positive for cough. Negative for shortness of breath and wheezing.   Gastrointestinal:  Positive for diarrhea. Negative for nausea and vomiting.     Physical Exam Triage Vital Signs ED Triage Vitals  Enc Vitals Group     BP 09/28/22 0917 (!) 153/101     Pulse Rate  09/28/22 0917 (!) 110     Resp 09/28/22 0917 18     Temp 09/28/22 0917 98 F (36.7 C)     Temp Source 09/28/22 0917 Oral     SpO2 09/28/22 0917 98 %     Weight 09/28/22 0918 125 lb (56.7 kg)     Height 09/28/22 0918 '5\' 6"'$  (1.676 m)     Head Circumference --      Peak Flow --      Pain Score 09/28/22 0918 6     Pain Loc --      Pain Edu? --      Excl. in [place]? --    No data found.  Updated Vital Signs BP (!) 153/101 (BP Location: Left Arm)   Pulse (!) 110   Temp 98 F (36.7 C) (Oral)   Resp 18   Ht '5\' 6"'$  (1.676 m)   Wt 125 lb (56.7 kg)   LMP 08/29/2022   SpO2 98%   BMI 20.18 kg/m   Visual Acuity Right Eye Distance:   Left Eye Distance:   Bilateral Distance:    Right Eye Near:   Left Eye Near:    Bilateral Near:     Physical Exam Vitals and nursing note reviewed.  Constitutional:      Appearance: Normal appearance. She is not ill-appearing.  HENT:     Head: Normocephalic and  atraumatic.     Right Ear: Tympanic membrane, ear canal and external ear normal. There is no impacted cerumen.     Left Ear: Tympanic membrane, ear canal and external ear normal. There is no impacted cerumen.     Nose: Congestion and rhinorrhea present.     Comments: Nasal mucosa is erythematous and edematous with clear rhinorrhea in both nares.    Mouth/Throat:     Mouth: Mucous membranes are moist.     Pharynx: Oropharynx is clear. Posterior oropharyngeal erythema present. No oropharyngeal exudate.     Comments: Posterior oropharynx has erythema and injection with clear postnasal drip. Cardiovascular:     Rate and Rhythm: Normal rate and regular rhythm.     Pulses: Normal pulses.     Heart sounds: Normal heart sounds. No murmur heard.    No friction rub. No gallop.  Pulmonary:     Effort: Pulmonary effort is normal.     Breath sounds: Normal breath sounds. No wheezing, rhonchi or rales.  Musculoskeletal:     Cervical back: Normal range of motion and neck supple.  Lymphadenopathy:     Cervical: No cervical adenopathy.  Skin:    General: Skin is warm and dry.     Capillary Refill: Capillary refill takes less than 2 seconds.     Findings: No erythema or rash.  Neurological:     General: No focal deficit present.     Mental Status: She is alert and oriented to person, place, and time.  Psychiatric:        Mood and Affect: Mood normal.        Behavior: Behavior normal.        Thought Content: Thought content normal.        Judgment: Judgment normal.      UC Treatments / Results  Labs (all labs ordered are listed, but only abnormal results are displayed) Labs Reviewed  RESP PANEL BY RT-PCR (RSV, FLU A&B, COVID)  [id number] - Abnormal; Notable for the following components:      Result Value   Influenza B by PCR POSITIVE (*)  All other components within normal limits    EKG   Radiology No results found.  Procedures Procedures (including critical care time)  Medications  Ordered in UC Medications - No data to display  Initial Impression / Assessment and Plan / UC Course  I have reviewed the triage vital signs and the nursing notes.  Pertinent labs & imaging results that were available during my care of the patient were reviewed by me and considered in my medical decision making (see chart for details).   Patient is a nontoxic-appearing 47 year old female here for evaluation of 4 days of respiratory symptoms as outlined in HPI above.  She reports that her husband recently had COVID and she was concerned that she had COVID herself.  She came in to be evaluated at her husband's insistence.  She has been quarantining at home.  On exam patient has inflamed nasal mucosa with clear rhinorrhea and clear postnasal drip with erythema and injection of the posterior oropharynx.  No cervical lymphadenopathy on exam.  Cardiopulmonary exam bisque lung sounds in all fields.  She is complaining of significant ear pain though both tympanic membranes are pearly gray in appearance without any effusion.  Both external auditory canals are clear.  I suspect that given her degree of upper respiratory inflammation that she is experiencing pressure from the inflamed upper respiratory tree.  Respiratory panel was collected at triage and is positive for influenza B.  She is outside the therapeutic window for treatment with Tamiflu but I will give her Atrovent nasal spray to help with the nasal congestion and postnasal drip that she can use 4 times a day.  I will also prescribe Tessalon Perles that she can use for cough during the day and Promethazine DM that she can use at bedtime to help with cough, congestion, and sleep.  She can use over-the-counter Tylenol and/or ibuprofen according to package instructions as needed for body aches or fever.  Return precautions reviewed.   Final Clinical Impressions(s) / UC Diagnoses   Final diagnoses:  Influenza B     Discharge Instructions      Take  OTC Tylenol and or Ibuprofen as needed for pain.  Use the Atrovent nasal spray, 2 squirts up each nostril every 6 hours, as needed for nasal congestion and runny nose.  Use over-the-counter Delsym, Zarbee's, or Robitussin during the day as needed for cough.  Use the Tessalon Perles every 8 hours as needed for cough.  Taken with a small sip of water.  You may experience some numbness to your tongue or metallic taste in her mouth, this is normal.  Use the Promethazine DM cough syrup at bedtime as will make you drowsy but it should help dry up your postnasal drip and aid you in sleep and cough relief.  Return for reevaluation, or see your primary care provider, for new or worsening symptoms.      ED Prescriptions     Medication Sig Dispense Auth. Provider   ipratropium (ATROVENT) 0.06 % nasal spray Place 2 sprays into both nostrils 4 (four) times daily. 15 mL Margarette Canada, NP   benzonatate (TESSALON) 100 MG capsule Take 2 capsules (200 mg total) by mouth every 8 (eight) hours. 21 capsule Margarette Canada, NP   promethazine-dextromethorphan (PROMETHAZINE-DM) 6.25-15 MG/5ML syrup Take 5 mLs by mouth 4 (four) times daily as needed. 118 mL Margarette Canada, NP      PDMP not reviewed this encounter.   Margarette Canada, NP 09/28/22 1020

## 2022-09-28 NOTE — Discharge Instructions (Signed)
Take OTC Tylenol and or Ibuprofen as needed for pain.  Use the Atrovent nasal spray, 2 squirts up each nostril every 6 hours, as needed for nasal congestion and runny nose.  Use over-the-counter Delsym, Zarbee's, or Robitussin during the day as needed for cough.  Use the Tessalon Perles every 8 hours as needed for cough.  Taken with a small sip of water.  You may experience some numbness to your tongue or metallic taste in her mouth, this is normal.  Use the Promethazine DM cough syrup at bedtime as will make you drowsy but it should help dry up your postnasal drip and aid you in sleep and cough relief.  Return for reevaluation, or see your primary care provider, for new or worsening symptoms.

## 2022-10-07 ENCOUNTER — Other Ambulatory Visit: Payer: Self-pay | Admitting: Obstetrics and Gynecology

## 2022-10-07 DIAGNOSIS — Z3041 Encounter for surveillance of contraceptive pills: Secondary | ICD-10-CM

## 2022-11-10 NOTE — Progress Notes (Unsigned)
PCP:  Patient, No Pcp Per   No chief complaint on file.    HPI:      [name] is a 47 y.o. [phone] whose LMP was No LMP recorded., presents today for her annual examination.  Her menses are regular every 28-30 days, lasting 3-4 days on OCPs.  Dysmenorrhea none. She does not have intermenstrual bleeding.  Hx of leio per 11/20 GYN u/s.  Fibroid 1: 42.4 x 40.0 x 45.0 mm subserosal right posterior Fibroid 2:11.2 x 10.0 x 10.6 mm subserosal anterior  Fibroid 3: 14.2 x 12.6 x 9.7 mm subserosal anterior left, calcified   Sex activity: currently sex active; contraception OCPs. No pain/bleeding. Last Pap: 09/25/21  Results were: no abnormalities /neg HPV DNA   Last mammogram: 10/26/21  Results were normal, repeat in 12 months There is no FH of breast cancer. There is no FH of ovarian cancer. The patient does not do self-breast exams.  Tobacco use: The patient denies current or previous tobacco use. Alcohol use: none No drug use.  Exercise: min active  Colonoscopy: never; has FH colon cancer in her uncle and aunt with polyps. Needs procedure at OP site for insurance purposes.  She does get adequate calcium but not Vitamin D in her diet. Normal labs 12/21; hx of Vit D deficiency 8/19.    Past Medical History:  Diagnosis Date   Basal cell carcinoma (BCC) of skin of nose    Leiomyoma    Scoliosis     Past Surgical History:  Procedure Laterality Date   CESAREAN SECTION     MOHS SURGERY     SKIN CANCER DESTRUCTION      Family History  Problem Relation Age of Onset   Migraines Mother    Diabetes Father    Colon polyps Paternal Aunt    Colon cancer Paternal Uncle    Congestive Heart Failure Maternal Grandmother    Diabetes Maternal Grandmother    Congestive Heart Failure Paternal Grandmother    Diabetes Paternal Grandmother    Breast cancer Neg Hx     Social History   Socioeconomic History   Marital status: Married    Spouse name: Not on file   Number of  children: Not on file   Years of education: Not on file   Highest education level: Not on file  Occupational History   Not on file  Tobacco Use   Smoking status: Never   Smokeless tobacco: Never  Vaping Use   Vaping Use: Never used  Substance and Sexual Activity   Alcohol use: No   Drug use: No   Sexual activity: Yes    Birth control/protection: Pill  Other Topics Concern   Not on file  Social History Narrative   Not on file   Social Determinants of Health   Financial Resource Strain: Not on file  Food Insecurity: Not on file  Transportation Needs: Not on file  Physical Activity: Not on file  Stress: Not on file  Social Connections: Not on file  Intimate Partner Violence: Not on file     Current Outpatient Medications:    benzonatate (TESSALON) 100 MG capsule, Take 2 capsules (200 mg total) by mouth every 8 (eight) hours., Disp: 21 capsule, Rfl: 0   ipratropium (ATROVENT) 0.06 % nasal spray, Place 2 sprays into both nostrils 4 (four) times daily., Disp: 15 mL, Rfl: 12   promethazine-dextromethorphan (PROMETHAZINE-DM) 6.25-15 MG/5ML syrup, Take 5 mLs by mouth 4 (four) times daily as needed.,  Disp: 118 mL, Rfl: 0   SPRINTEC 28 0.25-35 MG-MCG tablet, Take 1 tablet by mouth once daily, Disp: 84 tablet, Rfl: 0     ROS:  Review of Systems  Constitutional:  Negative for fatigue, fever and unexpected weight change.  Respiratory:  Negative for cough, shortness of breath and wheezing.   Cardiovascular:  Negative for chest pain, palpitations and leg swelling.  Gastrointestinal:  Positive for diarrhea. Negative for blood in stool, constipation, nausea and vomiting.  Endocrine: Negative for cold intolerance, heat intolerance and polyuria.  Genitourinary:  Negative for dyspareunia, dysuria, flank pain, frequency, genital sores, hematuria, menstrual problem, pelvic pain, urgency, vaginal bleeding, vaginal discharge and vaginal pain.  Musculoskeletal:  Negative for back pain, joint  swelling and myalgias.  Skin:  Negative for rash.  Neurological:  Negative for dizziness, syncope, light-headedness, numbness and headaches.  Hematological:  Negative for adenopathy.  Psychiatric/Behavioral:  Negative for agitation, confusion, sleep disturbance and suicidal ideas. The patient is not nervous/anxious.   BREAST: No symptoms   Objective: There were no vitals taken for this visit.   Physical Exam Constitutional:      Appearance: She is well-developed.  Genitourinary:     Vulva normal.     Right Labia: No rash, tenderness or lesions.    Left Labia: No tenderness, lesions or rash.    No vaginal discharge, erythema or tenderness.      Right Adnexa: not tender and no mass present.    Left Adnexa: not tender and no mass present.    No cervical friability or polyp.     Uterus is irregular.     Uterus is not enlarged or tender.  Breasts:    Right: No mass, nipple discharge, skin change or tenderness.     Left: No mass, nipple discharge, skin change or tenderness.  Neck:     Thyroid: No thyromegaly.  Cardiovascular:     Rate and Rhythm: Normal rate and regular rhythm.     Heart sounds: Normal heart sounds. No murmur heard. Pulmonary:     Effort: Pulmonary effort is normal.     Breath sounds: Normal breath sounds.  Abdominal:     Palpations: Abdomen is soft.     Tenderness: There is no abdominal tenderness. There is no guarding or rebound.  Musculoskeletal:        General: Normal range of motion.     Cervical back: Normal range of motion.  Lymphadenopathy:     Cervical: No cervical adenopathy.  Neurological:     General: No focal deficit present.     Mental Status: She is alert and oriented to person, place, and time.     Cranial Nerves: No cranial nerve deficit.  Skin:    General: Skin is warm and dry.  Psychiatric:        Mood and Affect: Mood normal.        Behavior: Behavior normal.        Thought Content: Thought content normal.        Judgment: Judgment  normal.  Vitals reviewed.     Assessment/Plan: Encounter for annual routine gynecological examination  Cervical cancer screening - Plan: Cytology - PAP  Screening for HPV (human papillomavirus) - Plan: Cytology - PAP  Encounter for surveillance of contraceptive pills - Plan: norgestimate-ethinyl estradiol (ORTHO-CYCLEN) 0.25-35 MG-MCG tablet; OCP RF  Encounter for screening mammogram for malignant neoplasm of breast - Plan: MM 3D SCREEN BREAST BILATERAL; [facility]  Screening for colon cancer -  Plan: Ambulatory referral to Gastroenterology; refer to [facility] for OP colonoscopy site   No orders of the defined types were placed in this encounter.            GYN counsel breast self exam, mammography screening, adequate intake of calcium and vitamin D, diet and exercise     F/U  No follow-ups on file.  Hiliary Osorto B. Dalaney Needle, PA-C 11/10/2022 6:01 PM

## 2022-11-11 ENCOUNTER — Ambulatory Visit (INDEPENDENT_AMBULATORY_CARE_PROVIDER_SITE_OTHER): Payer: BC Managed Care – PPO | Admitting: Obstetrics and Gynecology

## 2022-11-11 ENCOUNTER — Encounter: Payer: Self-pay | Admitting: Obstetrics and Gynecology

## 2022-11-11 VITALS — BP 118/80 | Ht 66.0 in | Wt 127.0 lb

## 2022-11-11 DIAGNOSIS — Z13220 Encounter for screening for lipoid disorders: Secondary | ICD-10-CM

## 2022-11-11 DIAGNOSIS — Z01419 Encounter for gynecological examination (general) (routine) without abnormal findings: Secondary | ICD-10-CM | POA: Diagnosis not present

## 2022-11-11 DIAGNOSIS — Z3041 Encounter for surveillance of contraceptive pills: Secondary | ICD-10-CM

## 2022-11-11 DIAGNOSIS — E559 Vitamin D deficiency, unspecified: Secondary | ICD-10-CM

## 2022-11-11 DIAGNOSIS — Z1231 Encounter for screening mammogram for malignant neoplasm of breast: Secondary | ICD-10-CM

## 2022-11-11 DIAGNOSIS — Z1211 Encounter for screening for malignant neoplasm of colon: Secondary | ICD-10-CM

## 2022-11-11 DIAGNOSIS — Z0000 Encounter for general adult medical examination without abnormal findings: Secondary | ICD-10-CM

## 2022-11-11 MED ORDER — NORGESTIMATE-ETH ESTRADIOL 0.25-35 MG-MCG PO TABS: 1.0000 | ORAL_TABLET | Freq: Every day | ORAL | 3 refills | Status: DC

## 2022-11-11 NOTE — Patient Instructions (Signed)
I value your feedback and you entrusting us with your care. If you get a [facility] patient survey, I would appreciate you taking the time to let us know about your experience today. Thank you!  [facility]: [phone]      

## 2022-12-03 LAB — LIPID PANEL WITH LDL/HDL RATIO
Cholesterol, Total: 189 mg/dL (ref 100–199)
HDL: 74 mg/dL (ref >39)
LDL Chol Calc (NIH): 93 mg/dL (ref 0–99)
LDL/HDL Ratio: 1.3 ratio (ref 0.0–3.2)
Triglycerides: 130 mg/dL (ref 0–149)
VLDL Cholesterol Cal: 22 mg/dL (ref 5–40)

## 2022-12-03 LAB — COMPREHENSIVE METABOLIC PANEL
ALT: 10 IU/L (ref 0–32)
AST: 16 IU/L (ref 0–40)
Albumin/Globulin Ratio: 1.3 (ref 1.2–2.2)
Albumin: 4 g/dL (ref 3.9–4.9)
Alkaline Phosphatase: 70 IU/L (ref 44–121)
BUN/Creatinine Ratio: 11 (ref 9–23)
BUN: 9 mg/dL (ref 6–24)
Bilirubin Total: 0.3 mg/dL (ref 0.0–1.2)
CO2: 19 mmol/L — ABNORMAL LOW (ref 20–29)
Calcium: 9.2 mg/dL (ref 8.7–10.2)
Chloride: 106 mmol/L (ref 96–106)
Creatinine, Ser: 0.81 mg/dL (ref 0.57–1.00)
Globulin, Total: 3 g/dL (ref 1.5–4.5)
Glucose: 92 mg/dL (ref 70–99)
Potassium: 4.3 mmol/L (ref 3.5–5.2)
Sodium: 141 mmol/L (ref 134–144)
Total Protein: 7 g/dL (ref 6.0–8.5)
eGFR: 91 mL/min/{1.73_m2} (ref >59)

## 2022-12-03 LAB — VITAMIN D 25 HYDROXY (VIT D DEFICIENCY, FRACTURES): Vit D, 25-Hydroxy: 29.2 ng/mL — ABNORMAL LOW (ref 30.0–100.0)

## 2023-01-29 IMAGING — MG MM DIGITAL DIAGNOSTIC UNILAT*R* W/ TOMO W/ CAD
6 of 10 series · 6 of 30 positions shown · non-contrast
Comparison: Baseline mammogram 10/16/2021.

CLINICAL DATA: Recall from baseline screening mammography, possible
asymmetries in the inner RIGHT breast and the outer RIGHT breast,
most conspicuous on the CC view.

EXAM:
DIGITAL DIAGNOSTIC UNILATERAL RIGHT MAMMOGRAM WITH TOMOSYNTHESIS AND
CAD
TECHNIQUE: Right digital diagnostic mammography and breast tomosynthesis was
performed. The images were evaluated with computer-aided detection.

[R ML synth-2D]
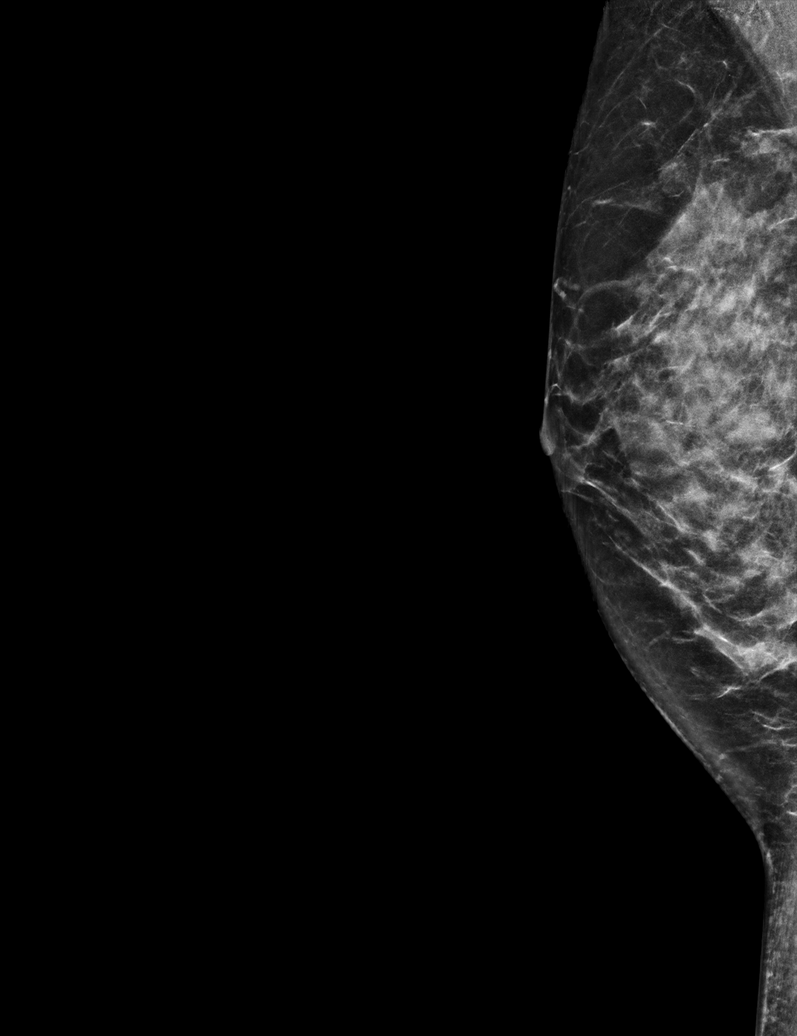

[R CC synth-2D (1 of 3)]
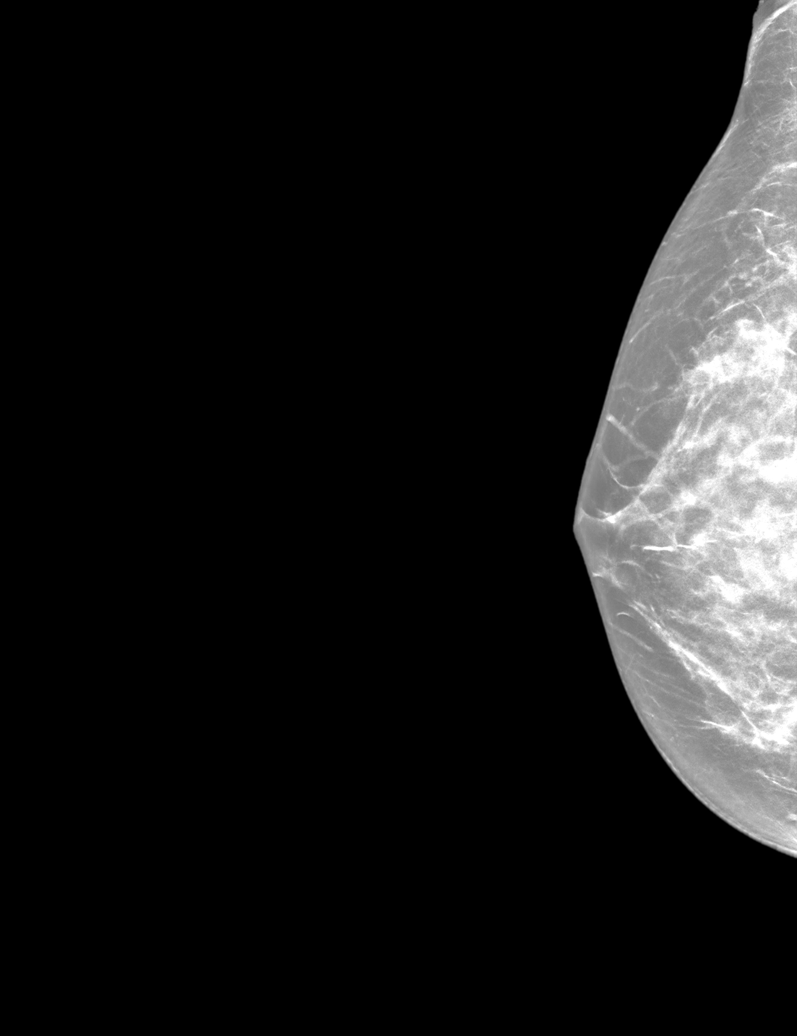

[R CC synth-2D (2 of 3)]
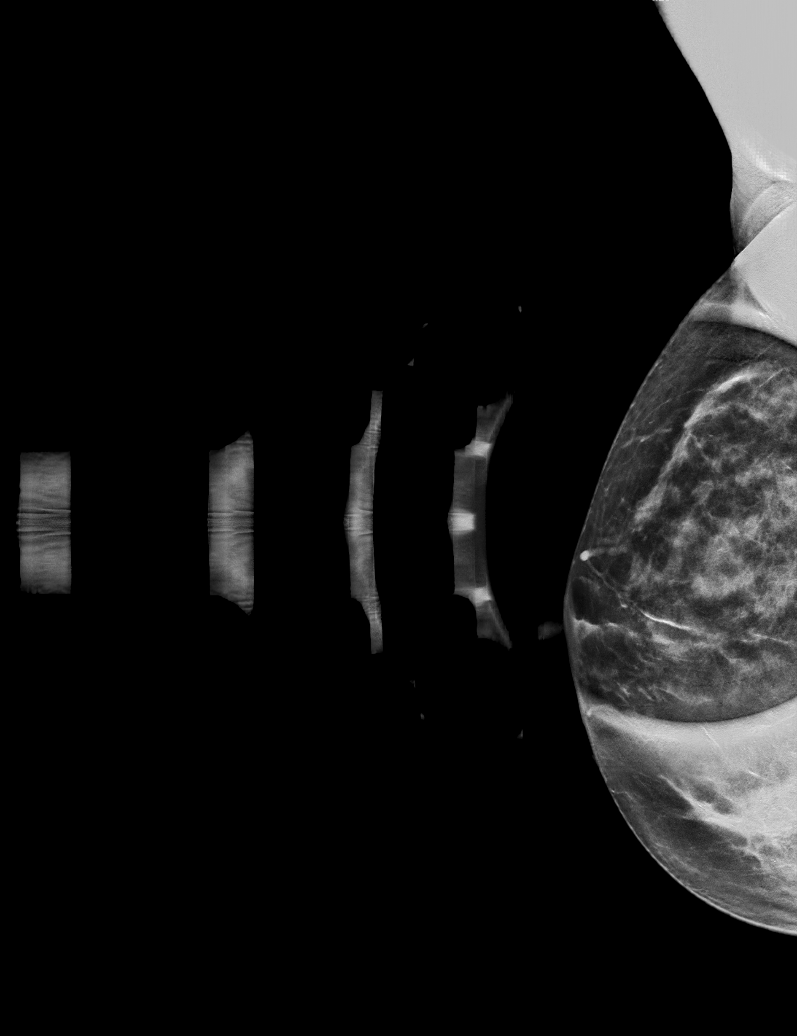

[R MLO synth-2D]
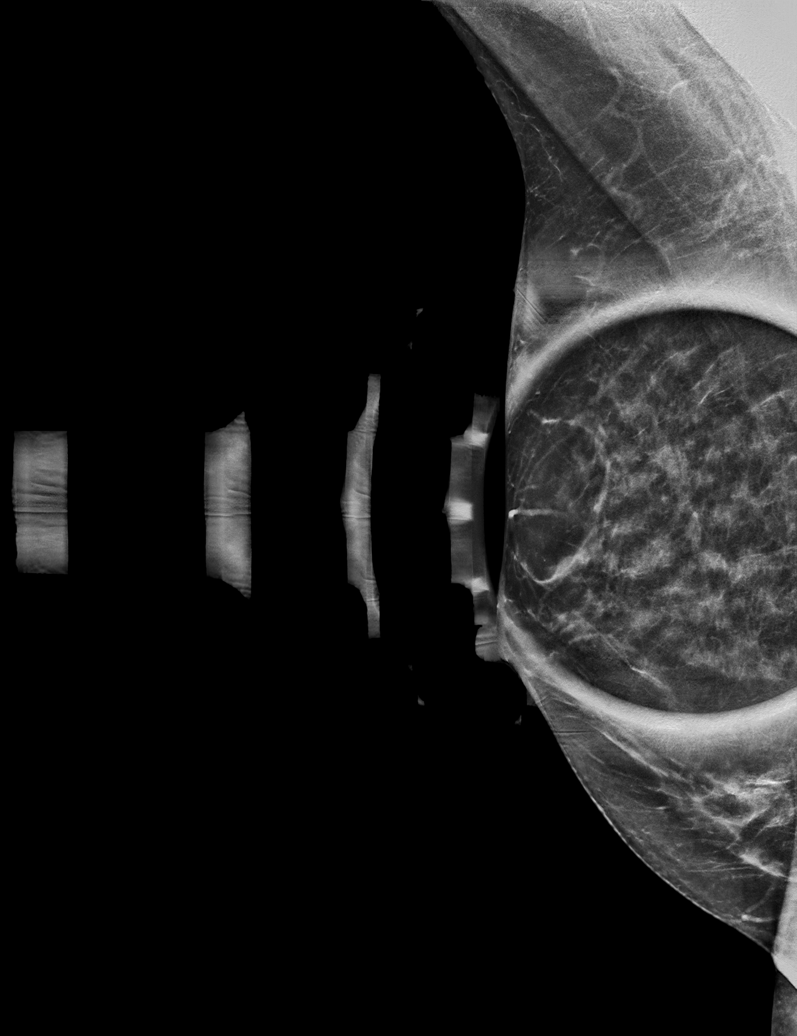

[R CC synth-2D (3 of 3)]
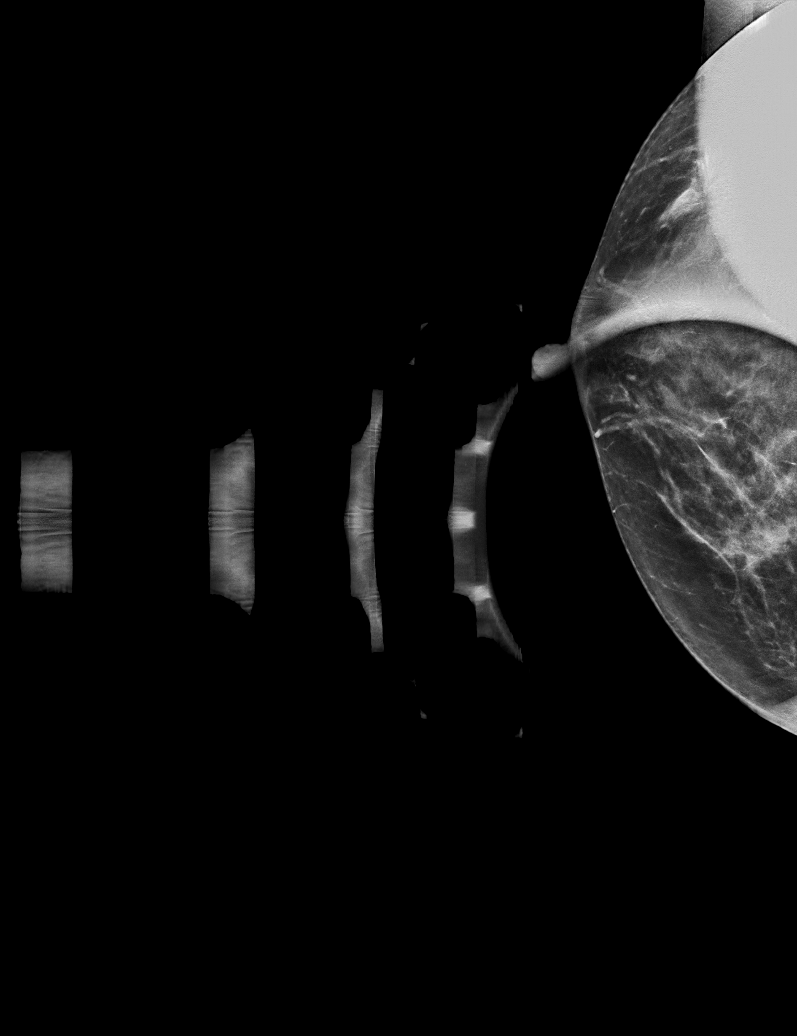

[R CC tomo · tomo slice 27/52.0]
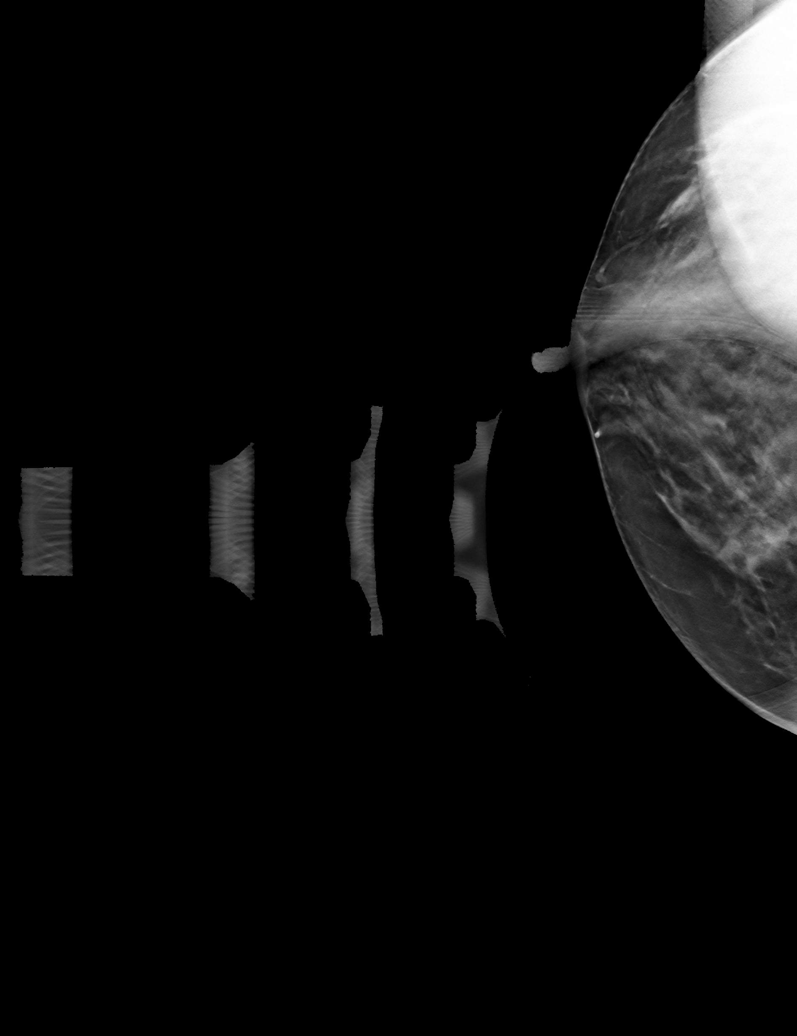

[6 of 30 positions shown; findings below may reference images not displayed]

ACR Breast Density Category c: The breast tissue is heterogeneously
dense, which may obscure small masses.
FINDINGS: Spot-compression CC view of the areas of concern, spot-compression
MLO view of a likely correlate for the asymmetry in the outer
breast, a full field mediolateral view, and a full field laterally
rolled CC view were obtained.

The asymmetries questioned in the inner breast and outer breast both
partially disperse with compression, likely indicating overlapping
fibroglandular tissue. There is no underlying mass or architectural
distortion. This is confirmed on the full field mediolateral view
and the full field rolled CC view.

No findings suspicious for malignancy.
IMPRESSION: No mammographic evidence of malignancy involving the RIGHT breast.

RECOMMENDATION:
Screening mammogram in one year.(Code:KN-4-JZL)

I have discussed the findings and recommendations with the patient.
If applicable, a reminder letter will be sent to the patient
regarding the next appointment.

BI-RADS CATEGORY  1: Negative.

## 2023-03-18 ENCOUNTER — Ambulatory Visit
Admission: RE | Admit: 2023-03-18 | Discharge: 2023-03-18 | Disposition: A | Discharge status: Home / Self Care | Payer: BC Managed Care – PPO | Source: Ambulatory Visit | Attending: Obstetrics and Gynecology | Admitting: Obstetrics and Gynecology

## 2023-03-18 DIAGNOSIS — Z1231 Encounter for screening mammogram for malignant neoplasm of breast: Secondary | ICD-10-CM | POA: Diagnosis present

## 2023-12-08 ENCOUNTER — Other Ambulatory Visit: Payer: Self-pay

## 2023-12-08 DIAGNOSIS — Z3041 Encounter for surveillance of contraceptive pills: Secondary | ICD-10-CM

## 2023-12-08 MED ORDER — NORGESTIMATE-ETH ESTRADIOL 0.25-35 MG-MCG PO TABS: 1.0000 | ORAL_TABLET | Freq: Every day | ORAL | 0 refills | Status: DC

## 2023-12-18 NOTE — Progress Notes (Deleted)
 PCP:  Patient, No Pcp Per   No chief complaint on file.    HPI:      [name] is a 48 y.o. [phone] whose LMP was No LMP recorded., presents today for her annual examination.  Her menses are regular every 28-30 days, lasting 4 days on OCPs.  Dysmenorrhea none. She does not have intermenstrual bleeding.  Hx of leio per 11/20 GYN u/s.  Fibroid 1: 42.4 x 40.0 x 45.0 mm subserosal right posterior Fibroid 2:11.2 x 10.0 x 10.6 mm subserosal anterior  Fibroid 3: 14.2 x 12.6 x 9.7 mm subserosal anterior left, calcified   Sex activity: currently sex active; contraception OCPs. No pain/bleeding. Has some dryness, hasn't tried lubricants. Last Pap: 09/25/21  Results were: no abnormalities /neg HPV DNA   Last mammogram: 03/18/23  Results were normal, repeat in 12 months There is no FH of breast cancer. There is no FH of ovarian cancer. The patient does not do self-breast exams.  Tobacco use: The patient denies current or previous tobacco use. Alcohol use: none No drug use.  Exercise: min active  Colonoscopy: 11/23 at [facility]; repeat afer 7 yrs. Has FH colon cancer in her uncle and aunt with polyps.   She does get adequate calcium and some Vitamin D in her diet. Labs with PCP    Past Medical History:  Diagnosis Date   Basal cell carcinoma (BCC) of skin of nose    Leiomyoma    Scoliosis    Vitamin D deficiency     Past Surgical History:  Procedure Laterality Date   CESAREAN SECTION     MOHS SURGERY     SKIN CANCER DESTRUCTION      Family History  Problem Relation Age of Onset   Migraines Mother    Diabetes Father    Colon polyps Paternal Aunt    Colon cancer Paternal Uncle    Congestive Heart Failure Maternal Grandmother    Diabetes Maternal Grandmother    Congestive Heart Failure Paternal Grandmother    Diabetes Paternal Grandmother    Breast cancer Neg Hx     Social History   Socioeconomic History   Marital status: Married    Spouse name: Not on file    Number of children: Not on file   Years of education: Not on file   Highest education level: Not on file  Occupational History   Not on file  Tobacco Use   Smoking status: Never   Smokeless tobacco: Never  Vaping Use   Vaping status: Never Used  Substance and Sexual Activity   Alcohol use: No   Drug use: No   Sexual activity: Yes    Birth control/protection: Pill  Other Topics Concern   Not on file  Social History Narrative   Not on file   Social Drivers of Health   Financial Resource Strain: Low Risk  (04/17/2023)   Received from [facility]   Overall Financial Resource Strain (CARDIA)    Difficulty of Paying Living Expenses: Not hard at all  Food Insecurity: No Food Insecurity (04/17/2023)   Received from [facility]   Hunger Vital Sign    Worried About Running Out of Food in the Last Year: Never true    Ran Out of Food in the Last Year: Never true  Transportation Needs: No Transportation Needs (04/17/2023)   Received from [facility] - Transportation    In the past 12 months,  has lack of transportation kept you from medical appointments or from getting medications?: No    Lack of Transportation (Non-Medical): No  Physical Activity: Not on file  Stress: Not on file  Social Connections: Not on file  Intimate Partner Violence: Not on file     Current Outpatient Medications:    Cholecalciferol (VITAMIN D-3 PO), Take by mouth., Disp: , Rfl:    norgestimate-ethinyl estradiol (SPRINTEC 28) 0.25-35 MG-MCG tablet, Take 1 tablet by mouth daily., Disp: 84 tablet, Rfl: 0     ROS:  Review of Systems  Constitutional:  Negative for fatigue, fever and unexpected weight change.  Respiratory:  Negative for cough, shortness of breath and wheezing.   Cardiovascular:  Negative for chest pain, palpitations and leg swelling.  Gastrointestinal:  Negative for blood in stool, constipation, diarrhea, nausea and vomiting.   Endocrine: Negative for cold intolerance, heat intolerance and polyuria.  Genitourinary:  Negative for dyspareunia, dysuria, flank pain, frequency, genital sores, hematuria, menstrual problem, pelvic pain, urgency, vaginal bleeding, vaginal discharge and vaginal pain.  Musculoskeletal:  Negative for back pain, joint swelling and myalgias.  Skin:  Negative for rash.  Neurological:  Negative for dizziness, syncope, light-headedness, numbness and headaches.  Hematological:  Negative for adenopathy.  Psychiatric/Behavioral:  Negative for agitation, confusion, sleep disturbance and suicidal ideas. The patient is not nervous/anxious.   BREAST: No symptoms   Objective: There were no vitals taken for this visit.   Physical Exam Constitutional:      Appearance: She is well-developed.  Genitourinary:     Vulva normal.     Right Labia: No rash, tenderness or lesions.    Left Labia: No tenderness, lesions or rash.    No vaginal discharge, erythema or tenderness.      Right Adnexa: not tender and no mass present.    Left Adnexa: not tender and no mass present.    No cervical friability or polyp.     Uterus is irregular.     Uterus is not enlarged or tender.  Breasts:    Right: No mass, nipple discharge, skin change or tenderness.     Left: No mass, nipple discharge, skin change or tenderness.  Neck:     Thyroid: No thyromegaly.  Cardiovascular:     Rate and Rhythm: Normal rate and regular rhythm.     Heart sounds: Normal heart sounds. No murmur heard. Pulmonary:     Effort: Pulmonary effort is normal.     Breath sounds: Normal breath sounds.  Abdominal:     Palpations: Abdomen is soft.     Tenderness: There is no abdominal tenderness. There is no guarding or rebound.  Musculoskeletal:        General: Normal range of motion.     Cervical back: Normal range of motion.  Lymphadenopathy:     Cervical: No cervical adenopathy.  Neurological:     General: No focal deficit present.      Mental Status: She is alert and oriented to person, place, and time.     Cranial Nerves: No cranial nerve deficit.  Skin:    General: Skin is warm and dry.  Psychiatric:        Mood and Affect: Mood normal.        Behavior: Behavior normal.        Thought Content: Thought content normal.        Judgment: Judgment normal.  Vitals reviewed.     Assessment/Plan: Encounter for annual routine gynecological examination  Encounter for  surveillance of contraceptive pills - Plan: norgestimate-ethinyl estradiol (SPRINTEC 28) 0.25-35 MG-MCG tablet; OCP RF  Encounter for screening mammogram for malignant neoplasm of breast - Plan: MM 3D SCREENING MAMMOGRAM BILATERAL BREAST; pt to schedule mammo  Blood tests for routine general physical examination - Plan: Lipid Panel With LDL/HDL Ratio, VITAMIN D 25 Hydroxy (Vit-D Deficiency, Fractures), VITAMIN D 25 Hydroxy (Vit-D Deficiency, Fractures), Lipid Panel With LDL/HDL Ratio, Comprehensive metabolic panel, Comprehensive metabolic panel  Screening cholesterol level - Plan: Lipid Panel With LDL/HDL Ratio  Vitamin D deficiency - Plan: VITAMIN D 25 Hydroxy (Vit-D Deficiency, Fractures)    No orders of the defined types were placed in this encounter.            GYN counsel breast self exam, mammography screening, adequate intake of calcium and vitamin D, diet and exercise     F/U  No follow-ups on file.  Jaime Reed B. Deidrea Gaetz, PA-C 12/18/2023 2:23 PM

## 2023-12-25 ENCOUNTER — Ambulatory Visit: Payer: Self-pay | Admitting: Obstetrics and Gynecology

## 2023-12-25 DIAGNOSIS — Z1231 Encounter for screening mammogram for malignant neoplasm of breast: Secondary | ICD-10-CM

## 2023-12-25 DIAGNOSIS — Z01419 Encounter for gynecological examination (general) (routine) without abnormal findings: Secondary | ICD-10-CM

## 2023-12-25 DIAGNOSIS — Z3041 Encounter for surveillance of contraceptive pills: Secondary | ICD-10-CM

## 2023-12-25 DIAGNOSIS — Z0000 Encounter for general adult medical examination without abnormal findings: Secondary | ICD-10-CM

## 2024-01-06 ENCOUNTER — Ambulatory Visit (INDEPENDENT_AMBULATORY_CARE_PROVIDER_SITE_OTHER): Payer: Self-pay | Admitting: Obstetrics and Gynecology

## 2024-01-06 ENCOUNTER — Encounter: Payer: Self-pay | Admitting: Obstetrics and Gynecology

## 2024-01-06 VITALS — BP 102/66 | Ht 67.0 in | Wt 127.0 lb

## 2024-01-06 DIAGNOSIS — Z01419 Encounter for gynecological examination (general) (routine) without abnormal findings: Secondary | ICD-10-CM | POA: Diagnosis not present

## 2024-01-06 DIAGNOSIS — D219 Benign neoplasm of connective and other soft tissue, unspecified: Secondary | ICD-10-CM

## 2024-01-06 DIAGNOSIS — L659 Nonscarring hair loss, unspecified: Secondary | ICD-10-CM

## 2024-01-06 DIAGNOSIS — Z3041 Encounter for surveillance of contraceptive pills: Secondary | ICD-10-CM

## 2024-01-06 DIAGNOSIS — Z1231 Encounter for screening mammogram for malignant neoplasm of breast: Secondary | ICD-10-CM

## 2024-01-06 MED ORDER — NORGESTIMATE-ETH ESTRADIOL 0.25-35 MG-MCG PO TABS: 1.0000 | ORAL_TABLET | Freq: Every day | ORAL | 3 refills | Status: AC

## 2024-01-06 NOTE — Progress Notes (Signed)
 PCP:  Antonio Baumgarten, MD   Chief Complaint  Patient presents with   Gynecologic Exam    No concerns     HPI:      [name] is a 48 y.o. [id number] whose LMP was Patient's last menstrual period was 12/30/2023 (exact date)., presents today for her annual examination.  Her menses are regular every 28-30 days, lasting 3-4 days on OCPs, light to mod flow.  Dysmenorrhea none. She does not have intermenstrual bleeding.  Hx of leio per 11/20 GYN u/s.  Fibroid 1: 42.4 x 40.0 x 45.0 mm subserosal right posterior Fibroid 2:11.2 x 10.0 x 10.6 mm subserosal anterior  Fibroid 3: 14.2 x 12.6 x 9.7 mm subserosal anterior left, calcified   Sex activity: currently sex active; contraception OCPs. No pain/bleeding/dryness Last Pap: 09/25/21  Results were: no abnormalities /neg HPV DNA   Last mammogram: 03/18/23  Results were normal, repeat in 12 months. There is no FH of breast cancer. There is no FH of ovarian cancer. The patient does not do self-breast exams.  Tobacco use: The patient denies current or previous tobacco use. Alcohol use: none No drug use.  Exercise: mod active  Colonoscopy: 11/23 at [facility]; repeat after 7 yrs. Has FH colon cancer in her uncle and aunt with polyps.   She does get adequate calcium and Vitamin D  in her diet. Labs with PCP now.  Several yr hx of hair thinning with thinner area on top of head/part. Had normal thyroid labs 8/24; not taking hair/nail vits. Female baldness in her FH. Hasn't seen derm about sx yet .   Past Medical History:  Diagnosis Date   Basal cell carcinoma (BCC) of skin of nose    Leiomyoma    Scoliosis    Vitamin D  deficiency     Past Surgical History:  Procedure Laterality Date   CESAREAN SECTION     MOHS SURGERY     SKIN CANCER DESTRUCTION      Family History  Problem Relation Age of Onset   Migraines Mother    Diabetes Father    Colon polyps Paternal Aunt    Colon cancer Paternal Uncle    Congestive Heart Failure  Maternal Grandmother    Diabetes Maternal Grandmother    Congestive Heart Failure Paternal Grandmother    Diabetes Paternal Grandmother    Breast cancer Neg Hx     Social History   Socioeconomic History   Marital status: Married    Spouse name: Not on file   Number of children: Not on file   Years of education: Not on file   Highest education level: Not on file  Occupational History   Not on file  Tobacco Use   Smoking status: Never   Smokeless tobacco: Never  Vaping Use   Vaping status: Never Used  Substance and Sexual Activity   Alcohol use: Yes    Comment: very rare   Drug use: No   Sexual activity: Yes    Birth control/protection: Pill  Other Topics Concern   Not on file  Social History Narrative   Not on file   Social Drivers of Health   Financial Resource Strain: Low Risk  (04/17/2023)   Received from [facility]   Overall Financial Resource Strain (CARDIA)    Difficulty of Paying Living Expenses: Not hard at all  Food Insecurity: No Food Insecurity (04/17/2023)   Received from [facility]   Hunger Vital Sign  Worried About Programme researcher, broadcasting/film/video in the Last Year: Never true    Ran Out of Food in the Last Year: Never true  Transportation Needs: No Transportation Needs (04/17/2023)   Received from [facility] - Transportation    In the past 12 months, has lack of transportation kept you from medical appointments or from getting medications?: No    Lack of Transportation (Non-Medical): No  Physical Activity: Not on file  Stress: Not on file  Social Connections: Not on file  Intimate Partner Violence: Not on file     Current Outpatient Medications:    Cholecalciferol (VITAMIN D -3 PO), Take by mouth., Disp: , Rfl:    norgestimate -ethinyl estradiol  (SPRINTEC 28) 0.25-35 MG-MCG tablet, Take 1 tablet by mouth daily., Disp: 84 tablet, Rfl: 3     ROS:  Review of Systems  Constitutional:   Negative for fatigue, fever and unexpected weight change.  Respiratory:  Negative for cough, shortness of breath and wheezing.   Cardiovascular:  Negative for chest pain, palpitations and leg swelling.  Gastrointestinal:  Negative for blood in stool, constipation, diarrhea, nausea and vomiting.  Endocrine: Negative for cold intolerance, heat intolerance and polyuria.  Genitourinary:  Negative for dyspareunia, dysuria, flank pain, frequency, genital sores, hematuria, menstrual problem, pelvic pain, urgency, vaginal bleeding, vaginal discharge and vaginal pain.  Musculoskeletal:  Negative for back pain, joint swelling and myalgias.  Skin:  Negative for rash.  Neurological:  Negative for dizziness, syncope, light-headedness, numbness and headaches.  Hematological:  Negative for adenopathy.  Psychiatric/Behavioral:  Negative for agitation, confusion, sleep disturbance and suicidal ideas. The patient is not nervous/anxious.   BREAST: No symptoms   Objective: BP 102/66   Ht 5\' 7"  (1.702 m)   Wt 127 lb (57.6 kg)   LMP 12/30/2023 (Exact Date)   BMI 19.89 kg/m    Physical Exam Constitutional:      Appearance: She is well-developed.  Genitourinary:     Vulva normal.     Right Labia: No rash, tenderness or lesions.    Left Labia: No tenderness, lesions or rash.    No vaginal discharge, erythema or tenderness.      Right Adnexa: not tender and no mass present.    Left Adnexa: not tender and no mass present.    No cervical friability or polyp.     Uterus is enlarged and irregular.     Uterus is not tender.  Breasts:    Right: No mass, nipple discharge, skin change or tenderness.     Left: No mass, nipple discharge, skin change or tenderness.  Neck:     Thyroid: No thyromegaly.  Cardiovascular:     Rate and Rhythm: Normal rate and regular rhythm.     Heart sounds: Normal heart sounds. No murmur heard. Pulmonary:     Effort: Pulmonary effort is normal.     Breath sounds: Normal breath  sounds.  Abdominal:     Palpations: Abdomen is soft.     Tenderness: There is no abdominal tenderness. There is no guarding or rebound.  Musculoskeletal:        General: Normal range of motion.     Cervical back: Normal range of motion.  Lymphadenopathy:     Cervical: No cervical adenopathy.  Neurological:     General: No focal deficit present.     Mental Status: She is alert and oriented to person, place, and time.     Cranial Nerves: No cranial nerve deficit.  Skin:    General: Skin is warm and dry.  Psychiatric:        Mood and Affect: Mood normal.        Behavior: Behavior normal.        Thought Content: Thought content normal.        Judgment: Judgment normal.  Vitals reviewed.     Assessment/Plan: Encounter for annual routine gynecological examination  Encounter for surveillance of contraceptive pills - Plan: norgestimate -ethinyl estradiol  (SPRINTEC 28) 0.25-35 MG-MCG tablet; OCP RF eRxd.   Encounter for screening mammogram for malignant neoplasm of breast - Plan: MM 3D SCREENING MAMMOGRAM BILATERAL BREAST; pt to schedule mammo  Leiomyoma - Plan: norgestimate -ethinyl estradiol  (SPRINTEC 28) 0.25-35 MG-MCG tablet; controlled with OCPs.   Hair loss disorder - Plan: Ferritin, B12 and Folate Panel, Testosterone,Free and Total; check labs, refer to derm for eval. Start hair/nail vits in meantime, can try minoxidil OTC   Meds ordered this encounter  Medications   norgestimate -ethinyl estradiol  (SPRINTEC 28) 0.25-35 MG-MCG tablet    Sig: Take 1 tablet by mouth daily.    Dispense:  84 tablet    Refill:  3    Supervising Provider:   ROBY, MICIA [[id number]]             GYN counsel breast self exam, mammography screening, adequate intake of calcium and vitamin D , diet and exercise     F/U  Return in about 1 year (around 01/05/2025).  Dianca Owensby B. Jada Fass, PA-C 01/06/2024 3:48 PM

## 2024-01-06 NOTE — Patient Instructions (Signed)
 I value your feedback and you entrusting [place] with your care. If you get a [facility] patient survey, I would appreciate you taking the time to let us know about your experience today. Thank you!  [facility] ([place])--[phone]

## 2024-01-08 ENCOUNTER — Encounter: Payer: Self-pay | Admitting: Obstetrics and Gynecology

## 2024-01-08 LAB — FERRITIN: Ferritin: 24 ng/mL (ref 15–150)

## 2024-01-08 LAB — TESTOSTERONE,FREE AND TOTAL
Testosterone, Free: <0.2 pg/mL (ref 0.0–4.2)
Testosterone: 3 ng/dL — ABNORMAL LOW (ref 4–50)

## 2024-01-08 LAB — B12 AND FOLATE PANEL
Folate: 12.6 ng/mL (ref >3.0)
Vitamin B-12: 199 pg/mL — ABNORMAL LOW (ref 232–1245)

## 2024-04-15 ENCOUNTER — Ambulatory Visit
Admission: RE | Admit: 2024-04-15 | Discharge: 2024-04-15 | Disposition: A | Discharge status: Home / Self Care | Source: Ambulatory Visit | Attending: Obstetrics and Gynecology | Admitting: Obstetrics and Gynecology

## 2024-04-15 DIAGNOSIS — Z1231 Encounter for screening mammogram for malignant neoplasm of breast: Secondary | ICD-10-CM | POA: Insufficient documentation

## 2024-04-19 ENCOUNTER — Ambulatory Visit: Payer: Self-pay | Admitting: Obstetrics and Gynecology
